# Patient Record
Sex: Male | Born: 1955 | Race: Black or African American | Hispanic: No | Marital: Married | Smoking: Never smoker
Health system: Southern US, Community
[De-identification: ages and names within clinical notes are randomized; demographics above are authoritative.]

## PROBLEM LIST (undated history)

## (undated) DIAGNOSIS — R600 Localized edema: Secondary | ICD-10-CM

## (undated) DIAGNOSIS — M2540 Effusion, unspecified joint: Secondary | ICD-10-CM

## (undated) DIAGNOSIS — I10 Essential (primary) hypertension: Secondary | ICD-10-CM

## (undated) DIAGNOSIS — M2550 Pain in unspecified joint: Secondary | ICD-10-CM

## (undated) DIAGNOSIS — G4730 Sleep apnea, unspecified: Secondary | ICD-10-CM

## (undated) DIAGNOSIS — R609 Edema, unspecified: Secondary | ICD-10-CM

## (undated) DIAGNOSIS — M1990 Unspecified osteoarthritis, unspecified site: Secondary | ICD-10-CM

## (undated) DIAGNOSIS — E785 Hyperlipidemia, unspecified: Secondary | ICD-10-CM

## (undated) HISTORY — PX: ACHILLES TENDON SURGERY: SHX542

## (undated) HISTORY — PX: KNEE ARTHROSCOPY: SUR90

## (undated) HISTORY — PX: COLONOSCOPY: SHX174

## (undated) HISTORY — PX: TONSILLECTOMY: SUR1361

---

## 2003-02-08 ENCOUNTER — Ambulatory Visit (HOSPITAL_COMMUNITY): Admission: RE | Admit: 2003-02-08 | Discharge: 2003-02-08 | Payer: Self-pay | Admitting: Orthopedic Surgery

## 2003-02-08 ENCOUNTER — Ambulatory Visit (HOSPITAL_BASED_OUTPATIENT_CLINIC_OR_DEPARTMENT_OTHER): Admission: RE | Admit: 2003-02-08 | Discharge: 2003-02-08 | Payer: Self-pay | Admitting: Orthopedic Surgery

## 2004-02-20 HISTORY — PX: JOINT REPLACEMENT: SHX530

## 2004-06-21 ENCOUNTER — Inpatient Hospital Stay (HOSPITAL_COMMUNITY): Admission: RE | Admit: 2004-06-21 | Discharge: 2004-06-25 | Payer: Self-pay | Admitting: Orthopedic Surgery

## 2005-11-13 ENCOUNTER — Inpatient Hospital Stay (HOSPITAL_COMMUNITY): Admission: EM | Admit: 2005-11-13 | Discharge: 2005-11-20 | Payer: Self-pay | Admitting: Emergency Medicine

## 2005-11-13 ENCOUNTER — Ambulatory Visit: Payer: Self-pay | Admitting: Internal Medicine

## 2005-11-14 ENCOUNTER — Encounter (INDEPENDENT_AMBULATORY_CARE_PROVIDER_SITE_OTHER): Payer: Self-pay | Admitting: Cardiovascular Disease

## 2005-11-14 ENCOUNTER — Encounter: Payer: Self-pay | Admitting: Vascular Surgery

## 2005-11-28 ENCOUNTER — Ambulatory Visit (HOSPITAL_BASED_OUTPATIENT_CLINIC_OR_DEPARTMENT_OTHER): Admission: RE | Admit: 2005-11-28 | Discharge: 2005-11-28 | Payer: Self-pay | Admitting: Cardiology

## 2005-12-02 ENCOUNTER — Ambulatory Visit: Payer: Self-pay | Admitting: Internal Medicine

## 2006-02-27 ENCOUNTER — Ambulatory Visit (HOSPITAL_BASED_OUTPATIENT_CLINIC_OR_DEPARTMENT_OTHER): Admission: RE | Admit: 2006-02-27 | Discharge: 2006-02-27 | Payer: Self-pay | Admitting: Cardiology

## 2006-03-03 ENCOUNTER — Ambulatory Visit: Payer: Self-pay | Admitting: Internal Medicine

## 2006-03-04 ENCOUNTER — Ambulatory Visit (HOSPITAL_BASED_OUTPATIENT_CLINIC_OR_DEPARTMENT_OTHER): Admission: RE | Admit: 2006-03-04 | Discharge: 2006-03-04 | Payer: Self-pay | Admitting: Cardiology

## 2006-10-16 ENCOUNTER — Ambulatory Visit (HOSPITAL_BASED_OUTPATIENT_CLINIC_OR_DEPARTMENT_OTHER): Admission: RE | Admit: 2006-10-16 | Discharge: 2006-10-16 | Payer: Self-pay | Admitting: Cardiology

## 2006-10-16 ENCOUNTER — Ambulatory Visit: Payer: Self-pay | Admitting: Internal Medicine

## 2007-11-04 ENCOUNTER — Ambulatory Visit (HOSPITAL_BASED_OUTPATIENT_CLINIC_OR_DEPARTMENT_OTHER): Admission: RE | Admit: 2007-11-04 | Discharge: 2007-11-04 | Payer: Self-pay | Admitting: *Deleted

## 2007-11-08 ENCOUNTER — Ambulatory Visit: Payer: Self-pay | Admitting: Internal Medicine

## 2008-10-18 ENCOUNTER — Ambulatory Visit (HOSPITAL_BASED_OUTPATIENT_CLINIC_OR_DEPARTMENT_OTHER): Admission: RE | Admit: 2008-10-18 | Discharge: 2008-10-18 | Payer: Self-pay | Admitting: Cardiology

## 2008-10-23 ENCOUNTER — Ambulatory Visit: Payer: Self-pay | Admitting: Internal Medicine

## 2009-10-25 ENCOUNTER — Ambulatory Visit (HOSPITAL_BASED_OUTPATIENT_CLINIC_OR_DEPARTMENT_OTHER): Admission: RE | Admit: 2009-10-25 | Discharge: 2009-10-25 | Payer: Self-pay | Admitting: Cardiology

## 2009-10-29 ENCOUNTER — Ambulatory Visit: Payer: Self-pay | Admitting: Internal Medicine

## 2010-07-04 NOTE — Procedures (Signed)
NAMEMARQUARIUS, [name]              ACCOUNT NO.:  [id number]   MEDICAL RECORD NO.:  [id number]          PATIENT TYPE:  OUT   LOCATION:  SLEEP CENTER                 FACILITY:  [facility]   PHYSICIAN:  Clinton D. Maple Hudson, MD, FCCP, FACPDATE OF BIRTH:  01/17/1957   DATE OF STUDY:  10/16/2006                        MAINTENANCE OF WAKEFULNESS TEST   REFERRING PHYSICIAN:  Osvaldo Shipper. Spruill, M.D.   INDICATION FOR STUDY:  Maintenance of wakefulness test requested for  evaluation of possible daytime hypersomnolence.  The patient has been  diagnosed with obstructive sleep apnea.   EPWORTH SLEEPINESS SCORE:  4/24.   BMI:  47.6.   WEIGHT:  315 pounds.   HOME MEDICATIONS:  Listed and reviewed.   A diagnostic NPSG on November 28, 2005 recorded an AHPI of 90.4 per hour  and required supplemental oxygen.  Subsequent CPAP titration on February 27, 2006, achieved good control at CPAP 24 cwp for an AHI of 2.7 per  hour.  A maintenance of wakefulness test on March 04, 2006 was normal  recording no sleep on any of the nap opportunities.  Followup NWT  is now requested.  The patient is currently using BiPAP inspiratory  20/expiratory 10 cwp.   NAP 1:  8:00 a.m.  Time in bed 40 minutes.  Sleep time 0.   NAP 2:  10:02 a.m. Time in bed 40 minutes.  Sleep time 0.   NAP 3:  12:00 p.m. Time in bed 40 minutes.  Total sleep time 0.   NAP 4:  2:00 p.m.  Time in bed 40 minutes.  Total sleep time 0.   MEAN SLEEP LATENCY:  Across all tests was 40 minutes, indicating absence  of sleep through this study.   IMPRESSION:  With no sleep on any nap, mean sleep latency was 40  minutes; and this is considered a normal study with no evidence of  daytime hypersomnolence.      Clinton D. Maple Hudson, MD, [facility], FACP  Diplomate, Biomedical engineer of Sleep Medicine  Electronically Signed     CDY/MEDQ  D:  10/20/2006 14:04:38  T:  10/21/2006 07:44:04  Job:  [id number]

## 2010-07-04 NOTE — Procedures (Signed)
[name], [name]              ACCOUNT NO.:  [id number]   MEDICAL RECORD NO.:  [id number]          PATIENT TYPE:  OUT   LOCATION:  SLEEP CENTER                 FACILITY:  [facility]   PHYSICIAN:  Clinton D. Maple Hudson, MD, FCCP, FACPDATE OF BIRTH:  1955/12/27   DATE OF STUDY:  10/18/2008                        MAINTENANCE OF WAKEFULNESS TEST   REFERRING PHYSICIAN:   REFERRING PHYSICIAN:  Osvaldo Shipper. Spruill, MD   INDICATIONS FOR STUDY:  Obstructive sleep apnea with hypersomnia.  Truck  driver.   EPWORTH SLEEPINESS SCORE:  Epworth sleepiness score 4/24, BMI 46.  Weight 5 feet 9 inches, weight 310 pounds.  Neck 19.5 inches.   HOME MEDICATIONS:  Charted and reviewed.   TEST ARCHITECTURE:  Test #1, 8 a.m., sleep latency 40 minutes, REM  latency NA.  Test #2, 10 a.m., sleep latency 40 minutes, REM latency NA.  Test #3, 12 noon, sleep latency 40 minutes, REM latency NA.  Test #4,  1400 hours, sleep latency 40 minutes, REM latency NA.   IMPRESSION/RECOMMENDATIONS:  1. The patient took no medications during the day per protocol and      maintained wakefulness during each test session with no evidence of      hypersomnia.  2. EKG was normal.  3. No unusual behavior or movement observed.  4. Original baseline diagnostic NPSG on November 28, 2005, had recorded      an AHI of 90 per hour with subsequent CPAP on February 27, 2006.      Clinton D. Maple Hudson, MD, [facility], FACP  Diplomate, Biomedical engineer of Sleep Medicine  Electronically Signed     CDY/MEDQ  D:  10/23/2008 10:54:24  T:  10/23/2008 16:10:96  Job:  [id number]

## 2010-07-04 NOTE — Procedures (Signed)
[name], [name]              ACCOUNT NO.:  [id number]   MEDICAL RECORD NO.:  [id number]          PATIENT TYPE:  OUT   LOCATION:  SLEEP CENTER                 FACILITY:  [facility]   PHYSICIAN:  Clinton D. Maple Hudson, MD, FCCP, FACPDATE OF BIRTH:  [birth year]-26-56   DATE OF STUDY:  11/04/2007                        MAINTENANCE OF WAKEFULNESS TEST   REFERRING PHYSICIAN:  Osvaldo Shipper. Spruill, M.D.   REFERRING PHYSICIAN:  Osvaldo Shipper. Spruill, M.D.   INDICATION FOR STUDY:  Maintenance of wakefulness test report for  evaluation of hypersomnia after treatment for sleep apnea.   EPWORTH SLEEPINESS SCORE:  3/24.  BMI of 45.  Height 5 feet 9 inches,  weight 308 pounds.  Neck 19.5 inches.   HOME MEDICATION:  Exforge 10/320, Lasix 20 mg, Lipitor 40 mg, Celebrex  200 mg, Janumet 50/1000.  No medications were taken throughout the study  day.   NAP 1:  8:00 a.m.  Sleep latency 40 minutes.  REM latency NA.   NAP 2:  10:00 a.m.  Sleep latency 40 minutes.  REM latency NA.   NAP 3:  12:06 p.m.  Sleep latency 40 minutes.  REM latency.   NAP 4:  1400 p.m.  Sleep latency 40 minutes.  REM latency NA.   MEAN SLEEP LATENCY/SUSTAINED WAKEFULNESS:  40 minutes.   NAPS WITH SLEEP:  Zero.   IMPRESSION/RECOMMENDATIONS:  1. The patient sustained wakefulness throughout the day and throughout      each session with no medications or stimulants taken.  He had worn      his BiPAP device (22/10 CWP) the night before from 2200 to 0430      hours.  There is no evidence of daytime somnolence on this study.  2. Original baseline diagnostic NPSG on November 28, 2005, had recorded      an AHI of 90 per hour.  Subsequent CPAP titration to 24 CWP was      adjusted for comfort to bilevel PAP now with a pressure of 22/10.      A mean      sleep latency test on October 16, 2006, with an Epworth sleepiness      score of 4/24, recorded no sleep and no evidence of daytime      somnolence on that study.      Clinton D. Maple Hudson, MD,  [facility], FACP  Diplomate, Biomedical engineer of Sleep Medicine  Electronically Signed     CDY/MEDQ  D:  11/08/2007 09:58:16  T:  11/10/2007 11:06:40  Job:  [id number]

## 2010-07-07 NOTE — Discharge Summary (Signed)
[name], [name]              ACCOUNT NO.:  [id number]   MEDICAL RECORD NO.:  [id number]          PATIENT TYPE:  INP   LOCATION:  2906                         FACILITY:  [facility]   PHYSICIAN:  Osvaldo Shipper. Spruill, M.D.DATE OF BIRTH:  1955-11-20   DATE OF ADMISSION:  11/13/2005  DATE OF DISCHARGE:  11/20/2005                                 DISCHARGE SUMMARY   ADMISSION OR PROVISIONAL DIAGNOSIS:  1. Congestive heart failure.  2. Uncontrolled hypertension.  3. Uncontrolled diabetes mellitus type 2.  4. Obesity.   DISCHARGE DIAGNOSES:  1. Compensated congestive heart failure.  2. Uncontrolled hypertension now controlled.  3. Type 2 diabetes mellitus.  4. Obesity.  5. Obesity hypoventilation syndrome.  6. Suspected sleep apnea with bradydysrhythmia.   BRIEF HISTORY AND REASON FOR ADMISSION:  This 55 year old black male was  admitted to the hospital with complaints of severe dyspnea.  The patient  reports that he has had a little high blood pressure all of his life, from  the early 20's, and recently has stopped taking his hydrochlorothiazide.  After discontinuing his hydrochlorothiazide he reports becoming short of  breath.  He came to the emergency room, was evaluated and was subsequently  admitted to the hospital with dyspnea.  The patient denied any severe chest  pain, further denied any severe leg edema, although some was present, and  also had no problems with any occupational exposure.  Due to the patient's  severe dyspnea, he was initially evaluated in the emergency room to exclude  heart failure as well as acute pulmonary embolism and was subsequently  admitted to the hospital as his dyspnea did not improve.  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  He will be admitted to the hospital for acute  treatment of suspected congestive heart failure.   LABORATORY STUDIES:  Laboratory studies obtained on the  patient's initial  presentation revealed, on September 25, a pH of 7.38, pCO2 of 69.2 and a pO2  of 79.  Hemoglobin on admission was 14.2, hematocrit 43.9, white blood cell  count 10,000.  Serum sodium was 142, potassium 4, chloride 97, CO2 content  36, BUN 13 and creatinine 1.  The patient's initial cardiac enzymes, CK and  troponin were both negative.   The patient's BNP was 140 on initial presentation, thyroid stimulating  hormone was normal.  The patient's hemoglobin A1c was 8.1 on initial  presentation.  His radiologic studies revealed the following:  On September  28, the patient had a Myoview scan of his heart which revealed an ejection  fraction of 52% and his chest x-ray revealed a dilated left ventricular  chamber.  Chest x-rays also revealed __________ and poor inspiratory pattern  on initial presentation and he also had a CT scan of the chest on initial  presentation which revealed edema on his CT but no evidence of any thrombi.   HOSPITAL COURSE:  The patient was admitted with the initial diagnosis of  congestive heart failure.  However, his Myoview perfusion scan and his  BNP  only showed mild heart failure, so other causes were sought for the  patient's complaints of severe dyspnea.  He was given IV Lasix for his heart  failure symptomatology and he improved somewhat.   After the patient was placed on telemetry baseline laboratory studies were  obtained and I included in the body of the patient's chart he revealed  evidence of moderate to severe bradydysrhythmia.  It was discovered that the  patient began his bradydysrhythmia at sleep and it was strongly suspected  that he has sleep apnea.   A consultation was requested by Pulmonary Medicine who evaluated the patient  and deduced that the patient had obesity hypoventilation syndrome as well as  probable sleep apnea.   Arrangements were made for the patient to get a sleep study so it could be  determined what CPAP  pressure he needs at night and he was placed on BiPAP  while he was hospitalized.   While on BiPAP the patient did not reveal any evidence of severe  bradydysrhythmia and it is strongly suspected that he does have severe  obstructive sleep apnea.   Adjustments were made in the patient's medications as well, as he is  diabetic and has been so for several years.  He also has hypertension.  Adjustments were made in his medications as well.  The patient showed some  improvement toward the end of his hospital course.  He was able to ambulate  without drops in his saturation levels.  His sats stayed at 90 or above with  ambulation and it was felt that he had reached maximum hospital benefit.   DISCHARGE MEDICATIONS:  1. Altace 5 mg p.o. daily.  2. Glucophage 500 mg p.o. b.i.d.  3. Januvia 100 mg p.o. daily.  4. K-Dur 20 mEq one p.o. daily.  5. Lasix 40 mg one p.o. daily.   The patient will be given instructions to have a sleep study as an  outpatient and also will return to my office in approximately 2 weeks for  further follow up.   The patient will also be referred to Pulmonary Medicine for follow up and  treatment of his obesity hypoventilation syndrome as well as for suspected  obstructive sleep apnea.   OVERALL CONDITION ON DISCHARGE:  Improved.   The patient will be discharged home with further office follow up.      Osvaldo Shipper. Spruill, M.D.  Electronically Signed     JOS/MEDQ  D:  11/21/2005  T:  11/22/2005  Job:  [id number]

## 2010-07-07 NOTE — Procedures (Signed)
[name], [name]              ACCOUNT NO.:  [id number]   MEDICAL RECORD NO.:  [id number]          PATIENT TYPE:  OUT   LOCATION:  SLEEP CENTER                 FACILITY:  [facility]   PHYSICIAN:  Clinton D. Maple Hudson, MD, FCCP, FACPDATE OF BIRTH:  07[birth year]   DATE OF STUDY:  03/04/2006                        MAINTENANCE OF WAKEFULNESS TEST   INDICATION FOR STUDY:  Documented obstructive sleep apnea, now on CPAP.  Baseline diagnostic NPSG on November 28, 2005 recorded an AHI of 90.4 per  hour with supplemental oxygen provided.  CPAP titration on February 27, 2006, gave best pressure setting of 24 CWP.   EPWORTH SLEEPINESS SCORE:  9/24   BMI:   MEDICATIONS:  Listed and reviewed.  Concern is with residual daytime  somnolence.   Patient maintained wakefulness through the entire procedure.  No  medications were taken during the day.   Nap Times:              Sleep Latency:                REM Latency:  1)  0800                      40 minutes (no sleep)  2)  1000                      40 minutes (no sleep)  3)  1200                      40 minutes (no sleep)  4)  1400  5)  1600                      40 minutes (no sleep)    MEAN SLEEP LATENCY:   COMMENTS:  Patient maintained wakefulness through the entire procedure.  No medications were taken during the day.   IMPRESSIONS-RECOMMENDATIONS:  Mean sleep latency of 40 minutes indicates  no evidence of excessive daytime sleepiness during this recording, using  standard maintenance of wakefulness test protocol.  The patient had used  home CPAP for nocturnal sleep on the night before.     Clinton D. Maple Hudson, MD, [facility], FACP  Diplomate, Biomedical engineer of Sleep Medicine  Electronically Signed    CDY/MEDQ  D:  03/10/2006 11:54:58  T:  03/10/2006 18:47:09  Job:  [id number]

## 2010-07-07 NOTE — Op Note (Signed)
[name], [name]                          ACCOUNT NO.:  [id number]   MEDICAL RECORD NO.:  [id number]                   PATIENT TYPE:  AMB   LOCATION:  DSC                                  FACILITY:  [facility]   PHYSICIAN:  Harvie Junior, M.D.                DATE OF BIRTH:  Nov 20, 1955   DATE OF PROCEDURE:  02/08/2003  DATE OF DISCHARGE:                                 OPERATIVE REPORT   PREOPERATIVE DIAGNOSIS:  Achilles tendinosis with Haglund's deformity.   POSTOPERATIVE DIAGNOSIS:  Achilles tendinosis with Haglund's deformity.   OPERATION PERFORMED:  1. Excision of bony exostosis of the calcaneus, i.e. Haglund's deformity.  2. Debridement of 20% of the Achilles tendon in the coronal plane.   SURGEON:  Harvie Junior, M.D.   ASSISTANT:  Marshia Ly, P.A.   ANESTHESIA:  General.   INDICATIONS FOR PROCEDURE:  The patient is a 55 year old male with a history  of having severe left ankle and heel pain.  He basically had been treated  conservatively for a long period of time with anti-inflammatory medication,  activity modification and physical therapy. None of this worked and because  of continuing complaints of pain he was ultimately evaluated.  MRI was  obtained which showed that he had significant partial thickness Achilles  tendon tear as well as large Haglund's deformity with bursitis.  Because of  these complaints, the patient was ultimately taken to the operating room for  debridement of the Haglund's deformity and evaluation and debridement of the  Achilles tendon as needed.   DESCRIPTION OF PROCEDURE:  The patient was taken to the operating room and  after adequate anesthesia was obtained with general anesthetic, the patient  was placed prone on the operating table.  All bony prominences were well  padded.  Care was taken to make sure that the face and genitalia were clear.  At this point attention was turned to the left leg where a tourniquet was  put in place and  after prep and drape of the left heel, the blood pressure  was inflated to 250 mmHg.  Following this, attention was turned to the left  side where an incision was made.  Subcutaneous tissues were dissected down  to the level of the calcaneus and subperiosteal reflection of the Achilles  tendon and periosteum was undertaken.  At that point, the Haglund's  deformity was clearly identified and under fluoroscopic guidance, the  Haglund's deformity was debrided.  Attention was turned to the Achilles  tendon which was dramatically thickened in the coronal plane.  Ultimately  20% of the width of the tendon was taken down at this point and once this  had been completed, the bursectomy was undertaken in that area and an OEC  was used to make sure that enough of the bony portion had been taken.  Calcification within the Achilles tendon had been debrided and the  tendinosis  had been debrided.  At this point the heel was copiously  irrigated and suctioned dry.  Bone wax was used over the exposed cancellous  surface.  The periosteal area on the lateral side was then closed and the  subcu  was closed with 0 and 2-0 Vicryl and the skin with a 3-0 nylon interrupted  suture.  Sterile compressive dressing applied as well as foot in slight  plantar flexion.  The patient was then transferred to the recovery room  where she was noted to be in satisfactory condition.  The estimated blood  loss for this procedure was none.                                               Harvie Junior, M.D.    Ranae Plumber  D:  02/08/2003  T:  02/09/2003  Job:  [id number]

## 2010-07-07 NOTE — Procedures (Signed)
NAMEARLANDER, Jeffrey              ACCOUNT NO.:  [id number]   MEDICAL RECORD NO.:  [id number]           PATIENT TYPE:   LOCATION:  SLEEP CENTER                 FACILITY:  [facility]   PHYSICIAN:  Clinton D. Maple Hudson, MD, FCCP, FACPDATE OF BIRTH:   DATE OF STUDY:  02/27/2006                            NOCTURNAL POLYSOMNOGRAM   REFERRING PHYSICIAN:  Osvaldo Shipper. Spruill, M.D.   INDICATION FOR STUDY:  Hypersomnia with sleep apnea.  Epworth sleepiness  score 9/24, BMI 46.1, weight 305 pounds.  Medications are listed and  reviewed.  CPAP titration is requested.  A diagnostic study on November 28, 2005 recorded an AHI of 90.4 per hour.   SLEEP ARCHITECTURE:  Total sleep time 321 minutes with sleep efficiency  81%.  Stage I was 5%, stage II 69%, stages III and IV were absent, REM  26% of total sleep time.  Sleep latency 8.5 minutes REM latency 48  minutes, awake after sleep onset 58 minutes, arousal index 11.  No  bedtime medication was taken.   RESPIRATORY DATA:  CPAP titration protocol.  CPAP was titrated to 24  CWP, AHI 2.7 per hour.  His personal medium [employer] 432 full  face mas was used initially, but had excessive leak.  Finally, a large  Respironics Comfort Lite II mask with large nasal pillows and chin strap  provided the best fit of several masks tried.   OXYGEN DATA:  Snoring was finally prevented at final pressures with  oxygen saturation 94% on room air.   CARDIAC DATA:  Sinus rhythm.   MOVEMENT/PARASOMNIA:  Occasional limb jerk with little effect on sleep.   IMPRESSION/RECOMMENDATION:  1. Successful CPAP titration at 24 CWP, AHI 2.7 per hour.  Several      masks were tried as noted before settling on a Respironics Comfort      Lite II with large nasal pillows and a chin strap.  Pressures as      high as 24 CWP will require a bilevel machine and the patient may      be more comfortable with a pressure adjustment, taking advantage of      that machine.  Consider an  initial trial with inspiratory pressure      24, expiratory pressure 18 CWP.  2. Baseline diagnostic NPSG on November 28, 2005 recorded an AHI of      90.4 per hour.      Clinton D. Maple Hudson, MD, [facility], FACP  Diplomate, Biomedical engineer of Sleep Medicine  Electronically Signed     CDY/MEDQ  D:  03/03/2006 12:34:38  T:  03/03/2006 23:53:03  Job:  [id number]

## 2010-07-07 NOTE — Discharge Summary (Signed)
[name], [name]              ACCOUNT NO.:  [id number]   MEDICAL RECORD NO.:  [id number]          PATIENT TYPE:  INP   LOCATION:  5038                         FACILITY:  [facility]   PHYSICIAN:  Harvie Junior, M.D.   DATE OF BIRTH:  06-05-1955   DATE OF ADMISSION:  06/21/2004  DATE OF DISCHARGE:  06/25/2004                                 DISCHARGE SUMMARY   ADMISSION DIAGNOSES:  1.  End-stage degenerative joint disease, right hip.  2.  Obesity.  3.  Type 2 diabetes mellitus.  4.  Hypertension.   DISCHARGE DIAGNOSES:  1.  End-stage degenerative joint disease, right hip.  2.  Obesity.  3.  Type 2 diabetes mellitus.  4.  Hypertension.   PROCEDURES IN HOSPITAL:  Right total hip arthroplasty by Jodi Geralds, M.D.  on Jun 21, 2004.   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  He has pain with ambulation and  night pain.  X-rays of his right hip show no fracture but did show end-stage  degenerative arthritis with complete loss of his joint space.  Based on his  clinical and radiographic findings and the fact that he did not improve with  conservative treatment, including modification of his activity and use of  pain medication, he is felt to be a candidate for a right total hip  arthroplasty and is admitted for this.   PERTINENT LABORATORY STUDIES:  EKG on admission showed a normal sinus rhythm  with a borderline left atrial enlargement.  No significant change since last  tracing of February 03, 2003.   Postoperative x-ray of the right hip showed right hip arthroplasty without  fracture or other complications.   Hemoglobin on admission was 15.4, hematocrit 46.8.  On postop day #1, his  hemoglobin was 7.7.  Postop day #2, 8.8.  Postop day #3, 8.4.  Indices were  within normal limits on the day of admission.  Pro time on admission was  12.4 seconds with an INR of 0.9.  PTT was 27.  On the day of discharge,  his  pro time was 17.1 seconds with an INR of 1.6.  CMET on admission was within  normal limits.  He had an elevated glucose at 149.  BMET on postop day #1  showed a creatinine of 2.1 and a glucose of 153.  Urinalysis on admission  was unremarkable on Jun 19, 2004 and on Jun 23, 2004, it was also negative.  Urine culture on Jun 23, 2004 showed no growth, one day.   HOSPITAL COURSE:  [name] underwent right total hip arthroplasty on Jun 21, 2004 by Dr. Luiz Blare and is well described in Dr. Luiz Blare' operative report.  Postoperatively, the patient was put on PCA and morphine pump for pain  control and was given Ancef 1 gm IV q.8h. x5 doses.  He was also started on  Lovenox due to the patient's size until his Coumadin was felt to be  therapeutic.  On postop day #1, he had moderate right hip pain.  He had not  gotten out of bed yet.  He had no chest pain or shortness of breath.  His  vital signs were stable.  He is afebrile.  He is a little tachycardic with a  pulse of 108.  O2 sats were 94% on 2 liters of oxygen.  Hemoglobin was 10.7.  INR was 1.3.  Creatinine was 2.1.  Potassium 4.8.  Sodium 135.   He has gotten out of bed with physical therapy.  Foley catheter was  continued.  IV fluids were instituted, and we gave him a bit of a bolus of  1000 cc of fluids.  The patient made good progress.  On postoperative day  #2, he had moderate right hip pain.  He was out of bed to the chair.  He was  taking fluids and voiding without difficulty.  His Foley catheter was  removed.  His postoperative x-rays of the right hip showed good position and  alignment of the prosthesis.  Hemoglobin was 8.8.  Pro time was 15.8 seconds  with an INR of 1.4.  His IV was converted to a saline lock.  His PCA and  morphine pump was discontinued.  His Foley catheter was discontinued.  He  was continued on Lovenox and Coumadin for DVT prophylaxis.  The patient had  good urine output at that point with good function.   On  postop day #3, he was making good progression.  He was then discharged  home on Jun 25, 2004 in improved condition.  He was on a regular diabetic  diet.  He was taking fluids without difficulty and voiding well.  He had  spiked a fever up to 100 and was found to be afebrile.  His vital signs are  stable.  His wounds had some mild serous drainage on the right hip.  The  dressing was changed, and the wound was painted with Betadine prior to  discharge.  The patient was discharged home in improved condition.   Activity status will be to ambulate with a walker, weightbearing as  tolerated on the right.  He is given an RX for Percocet p.r.n. for pain 5 mg  1-2 q.4-6h. p.r.n. pain.   I will follow up with Dr. Luiz Blare in one week.  He will change his right hip  dressing every other day.  He will notify us if he has any problems such as  increased fever, chills, sweats, increased hip pain, or redness around his  right hip wound.      Marshia Ly, P.A.      Harvie Junior, M.D.  Electronically Signed    JB/MEDQ  D:  10/11/2004  T:  10/11/2004  Job:  [id number]   cc:   Osvaldo Shipper. Spruill, M.D.  [address] [id number]  [place]  [place] [address]  Fax: [phone]

## 2010-07-07 NOTE — Procedures (Signed)
[name], [name]              ACCOUNT NO.:  [id number]   MEDICAL RECORD NO.:  [id number]          PATIENT TYPE:  OUT   LOCATION:  SLEEP CENTER                 FACILITY:  [facility]   PHYSICIAN:  Clinton D. Maple Hudson, MD, FCCP, FACPDATE OF BIRTH:  1956-11-[birth year]   DATE OF STUDY:                              NOCTURNAL POLYSOMNOGRAM   REFERRING PHYSICIAN:  Dr. Kevin Fenton Spruill   INDICATION FOR STUDY:  Hypersomnia with sleep apnea.   EPWORTH SLEEPINESS SCORE:  11/24.  BMI 47.6.  Weight 315 pounds.   HOME MEDICATIONS:  Simvastatin, Janumet 50/500, Diovan, Celebrex, potassium,  Lasix.   An NPSG diagnostic protocol was requested.   SLEEP ARCHITECTURE:  Total sleep time 365 minutes with sleep efficiency 81%.  Stage 1 was 19%, stage 2 64%, stages 3 and 4 were absent, REM 16% of total  sleep time.  Sleep latency 20 minutes, REM latency 103 minutes, awake after  sleep onset 64 minutes, arousal index 55.9 indicating marked sleep  fragmentation.  No bedtime medication was taken.   RESPIRATORY DATA:  NPSG protocol.  Apnea/hypopnea index (AHI, RDI) 90.4  obstructive events per hour indicating severe obstructive sleep  apnea/hypopnea syndrome.  There were 483 obstructive apneas, one mixed  apnea, and 66 hypopneas.  Events were not positional.  REM AHI 74.9.   OXYGEN DATA:  Very loud snoring.  Oxygen desaturated to 50%.  At 2320 the  patient was put on 1 L nasal oxygen and at 0007 the oxygen was increased to  2 L nasal prongs because of ongoing severe oxygen desaturation.  Desaturations continued through the night associated with apneas.   CARDIAC DATA:  Sinus arrhythmia consistent with vagal stimulation from apnea  events, with heart rate ranging 61-88 beats per minute.   MOVEMENT/PARASOMNIA:  Insignificant rare limb jerk.  Bathroom x1.   IMPRESSION/RECOMMENDATION:  1. Severe obstructive sleep apnea/hypopnea syndrome, apnea/hypopnea index      90.4 per hour with nonpositional events and very loud  snoring.  2. Supplemental oxygen was provided at 2 L per minute by nasal prongs      because of significant oxygen desaturation as low as 50%.  Mean oxygen      saturation through the night was 87% despite oxygen      supplementation, mainly reflecting repeated desaturations with apneas.  3. Consider early return for CPAP titration or evaluate for alternative      therapies on an urgent basis.      Clinton D. Maple Hudson, MD, [facility], FACP  Diplomate, Biomedical engineer of Sleep Medicine  Electronically Signed     CDY/MEDQ  D:  12/02/2005 09:21:11  T:  12/03/2005 11:36:14  Job:  [id number]

## 2010-07-07 NOTE — Discharge Summary (Signed)
NAMEKY, RUMPLE              ACCOUNT NO.:  [id number]   MEDICAL RECORD NO.:  [id number]          PATIENT TYPE:  INP   LOCATION:  2906                         FACILITY:  [facility]   PHYSICIAN:  Ivery Quale, P.A.    DATE OF BIRTH:  11/19/1955   DATE OF ADMISSION:  11/13/2005  DATE OF DISCHARGE:  11/20/2005                                 DISCHARGE SUMMARY   DISCHARGE DIAGNOSES:  1. Congestive heart failure.  2. Chronic respiratory failure.  3. Hypertension.  4. Type 2 diabetes mellitus.  5. Sleep apnea.   CONSULTATIONS:  Dr. Sandrea Hughs.   PROCEDURE:  Continuous positive airway pressure.   HISTORY OF PRESENT ILLNESS:  Mr. Knobel is a 55 year old patient who  presented initially to the emergency department of the [facility]  by ambulance with the complaint of shortness of breath.  It is of note that  the patient had been off the hydrochlorothiazide for 2-1/2 weeks and  presented to the emergency department with shortness of breath.  He had been  at work and got short of breath.  He went to a primary care and was then  sent to the emergency department.  On the examination in the emergency  department, he was noted to have pitting edema of the lower extremities.  His cardiac markers were negative.  His white blood cell count was slightly  elevated at 13,100.  His D-dimer was elevated at 1.24.  His potassium was  slightly low at 3.2.  His renal function was within normal limits.  His  liver function was within normal limits.  His B-natriuretic peptide was  elevated at 140.  The arterial blood gas revealed a pH of 7.38, PCO2 of  69.2, PO2 of 70, a bicarb of 41, and an O2 saturation of 95%.  Chest x-ray  questioned edema, but this was a poor inspiratory film.   The patient was subsequently admitted to the medical service.  He was seen  on consultation by pharmacy for heparin protocol.  The patient had a lower  extremity Doppler study done and there was no evidence of  DVT, SVT, or Baker  cyst.  CT scan of the chest was somewhat limited due to body habitus, but no  pulmonary emboli was seen.  There was some cardiomegaly with central  bilateral air-space opacities compatible with edema or congestive heart  failure.  Repeat cardiac markers were negative for acute injury.   During sleep, the patient's heart rate dropped to 44 beats per minute.  When  awakened, he stated that he felt fine.  After awakening, his heart rate came  up to the 60s and 70s without any significant problem.   On November 15, 2005, a consultation was obtained with Dr. Sherene Sires.  It was  Dr. Thurston Hole opinion that the patient needed a CPAP and this was ordered.  It  was also suggested that he be followed up by a sleep specialist and this was  arranged.  It was also the opinion that the patient probably had dyspnea due  to volume overload, especially since he had been without his  hydrochlorothiazide for approximately 2 weeks.   The patient was eventually transferred from the intensive care unit to the  stepdown.   The patient continued to show improvement and on November 20, 2005, it was the  opinion of the attending physician and consulting physician that the patient  had received maximum benefit from this hospitalization.  He responded well  to medication and treatment.  The patient was set up for BiPAP at home.   DISCHARGE MEDICATIONS:  1. Zocor 20 mg daily.  2. Diovan 320 mg daily.  3. Glucophage 500 mg b.i.d.  4. Lasix 20 mg daily.  5. BiPAP at bedtime.   FOLLOWUP:  The patient is scheduled for a sleep study on December 26, 2005,  at 8:30 p.m.  He is to use his Janimine 100 mg daily.  He is to notify the  physician immediately if any changes, problems, or concerns.      Ivery Quale, P.A.     HB/MEDQ  D:  12/25/2005  T:  12/26/2005  Job:  [id number]

## 2010-07-07 NOTE — Op Note (Signed)
NAMETROOPER, OLANDER              ACCOUNT NO.:  [id number]   MEDICAL RECORD NO.:  [id number]          PATIENT TYPE:  INP   LOCATION:  2550                         FACILITY:  [facility]   PHYSICIAN:  Harvie Junior, M.D.   DATE OF BIRTH:  Oct 14, 1955   DATE OF PROCEDURE:  06/21/2004  DATE OF DISCHARGE:                                 OPERATIVE REPORT   PREOPERATIVE DIAGNOSIS:  Degenerative joint disease, right hip, end stage.   POSTOPERATIVE DIAGNOSIS:  Degenerative joint disease, right hip, end stage.   OPERATION PERFORMED:  Right total hip replacement with the [facility]  S-ROM system, 2015 stem, 20D small cone, a 56 acetabulum with a 36 metal on  metal head.  The neck was a +3 on a 36+8 offset base neck stem.   SURGEON:  Harvie Junior, M.D.   ASSISTANT:  Marshia Ly, P.A.   ANESTHESIA:  General.   INDICATIONS FOR PROCEDURE:  He is a 55 year old gentleman who presented with  chief complaints of right hip pain and inability to move the right hip.  He  had been treated for prolonged period of time with anti-inflammatory  medication and activity modification.  Because of continued complaints of  significant pain, he was ultimately taken to the operating room for right  total hip replacement.   DESCRIPTION OF PROCEDURE:  The patient was taken to the operating room and  after anesthesia was obtained with general anesthetic.  The patient was  placed on the operating table and moved into the left lateral decubitus  position.  All bony prominences were well padded.  Attention was turned to  the right hip where after routine prep and drape, an incision was made and  subcutaneous tissue was dissected down to the level of the tensor fascia  which was divided in line with its fibers.  Subcutaneous tissue was  dissected down to the level of the posterior aspect of the hip.  The short  external rotators as well as the piriformis were released from the posterior  intertrochanteric  line.  The hip was then dislocated.  A provisional neck  cut was made and attention was turned to the acetabulum which was  sequentially reamed, medialized quite significantly and then sequentially  reamed to a level of 56.  Excellent bleeding bone was obtained and the 56  cup was hammered into place at this point.  Following this, the trial metal  liner was put in place and attention was turned to the stem side,  sequentially reamed to a level of 15 and a 15.5 was taken three quarters of  the way down.  Attention was then turned to the stem which was reamed to a  20D small cone.  Following this, attention was turned towards the trial  where a 36 +8 was put in place with 10 degrees of anteversion and a 36 + 3  ball.  The hip was trialed at this point and excellent range of motion was  achieved. There was a little bit of tendency towards dislocation.  It  appeared to be impinging on anterior osteophytes.  These were then debrided  and the final cup was put in place. The final cone was then seated 20/15.  The stem was then placed with 10 degrees of anteversion to the cone and the  attention was turned towards the trial ball.  A +3 ball was used with a 36  +8 stem.  Excellent range of motion, excellent lateralization.  The hip was  easily ranged at this point, no tendency towards subluxation, dislocation.  At this point the short external rotators and piriformis were reattached to  the posterior intertrochanteric line and the wound was copiously irrigated  and suctioned dry.  Tensor fascia closed with 1 Vicryl  running suture, the skin with 0 and 2-0 Vicryl and skin with skin staples.  Sterile compressive dressing was applied as well as a knee immobilizer and  patient taken to recovery where she was noted to be in satisfactory  condition.  The estimated blood loss for this procedure was none.      JLG/MEDQ  D:  06/21/2004  T:  06/21/2004  Job:  [id number]

## 2010-10-30 ENCOUNTER — Ambulatory Visit (HOSPITAL_BASED_OUTPATIENT_CLINIC_OR_DEPARTMENT_OTHER): Payer: PRIVATE HEALTH INSURANCE | Attending: Cardiology

## 2010-10-30 DIAGNOSIS — G4733 Obstructive sleep apnea (adult) (pediatric): Secondary | ICD-10-CM | POA: Insufficient documentation

## 2010-11-11 DIAGNOSIS — G4730 Sleep apnea, unspecified: Secondary | ICD-10-CM

## 2010-11-11 DIAGNOSIS — G4710 Hypersomnia, unspecified: Secondary | ICD-10-CM

## 2010-11-11 NOTE — Procedures (Signed)
NAMESOLON, [name]              ACCOUNT NO.:  [id number]  MEDICAL RECORD NO.:  [id number]          PATIENT TYPE:  OUT  LOCATION:  SLEEP CENTER                 FACILITY:  [facility]  PHYSICIAN:  Cassandra Mcmanaman D. Maple Hudson, MD, FCCP, FACPDATE OF BIRTH:  24-Jun-1955  DATE OF STUDY:  10/30/2010                       MAINTENANCE OF WAKEFULNESS TEST  REFERRING PHYSICIAN:  Osvaldo Shipper. Spruill, M.D.  INDICATION FOR STUDY:  Obstructive sleep apnea with hypersomnia.  Truck driver.  EPWORTH SLEEPINESS SCORE:  2/24.  BMI:  46.5.  Weight 315 pounds.  Height 69 inches.  Neck 19.5 inches.  MEDICATIONS:  Home medications are charted and reviewed.  NAP 1:  At 8 a.m., sleep latency 40 minutes (no sleep), REM latency NA.  NAP 2:  At 10 a.m., sleep latency 40 minutes, REM latency NA.  NAP 3:  At 12 noon, sleep latency 40 minutes, REM latency NA.  NAP 4:  At 1400 p.m., sleep latency 40 minutes, REM latency NA.  Mean sustained wakefulness 40 minutes.  NAP 5:   MEAN SLEEP LATENCY:  COMMENTS:  IMPRESSIONS-RECOMMENDATIONS: 1. No medications were taken during the day per protocol.  The patient     maintained wakefulness during each test session with no evidence of     hypersomnia.  This is a normal study. 2. Original baseline diagnostic and PSG on November 28, 2005 had     recorded an AHI of 19.4 per hour.  Weight was     315 pounds.  CPAP was titrated on February 27, 2006 to 24 CWP for an     AHI of 2.7 per hour.  Weight then was 305 pounds.     Lamia Mariner D. Maple Hudson, MD, [facility], FACP Diplomate, Biomedical engineer of Sleep Medicine Electronically Signed    CDY/MEDQ  D:  11/11/2010 09:32:37  T:  11/11/2010 09:46:26  Job:  [id number]

## 2011-03-05 ENCOUNTER — Other Ambulatory Visit: Payer: Self-pay | Admitting: Orthopedic Surgery

## 2011-03-05 DIAGNOSIS — M25512 Pain in left shoulder: Secondary | ICD-10-CM

## 2011-03-08 ENCOUNTER — Ambulatory Visit
Admission: RE | Admit: 2011-03-08 | Discharge: 2011-03-08 | Disposition: A | Discharge status: Home / Self Care | Payer: Worker's Compensation | Source: Ambulatory Visit | Attending: Orthopedic Surgery | Admitting: Orthopedic Surgery

## 2011-03-08 DIAGNOSIS — M25512 Pain in left shoulder: Secondary | ICD-10-CM

## 2011-03-10 ENCOUNTER — Other Ambulatory Visit: Payer: PRIVATE HEALTH INSURANCE

## 2011-03-20 ENCOUNTER — Other Ambulatory Visit: Payer: Self-pay | Admitting: Orthopedic Surgery

## 2011-03-21 ENCOUNTER — Encounter (HOSPITAL_BASED_OUTPATIENT_CLINIC_OR_DEPARTMENT_OTHER): Payer: Self-pay | Admitting: *Deleted

## 2011-03-21 NOTE — Progress Notes (Signed)
To come in for bmet-ekg-dr spruils office called for notes-and to let him know he is having this surg-did he have any issues concerning this-dr spruill will be off the next 5 days To to bring all meds-bipap-overnight bag

## 2011-03-22 ENCOUNTER — Encounter (HOSPITAL_BASED_OUTPATIENT_CLINIC_OR_DEPARTMENT_OTHER)
Admission: RE | Admit: 2011-03-22 | Discharge: 2011-03-22 | Disposition: A | Discharge status: Home / Self Care | Payer: Worker's Compensation | Source: Ambulatory Visit | Attending: Orthopedic Surgery | Admitting: Orthopedic Surgery

## 2011-03-22 NOTE — Progress Notes (Signed)
Reviewed notes with dr Sabra Heck for dsc

## 2011-03-23 ENCOUNTER — Encounter (HOSPITAL_BASED_OUTPATIENT_CLINIC_OR_DEPARTMENT_OTHER)
Admission: RE | Admit: 2011-03-23 | Discharge: 2011-03-23 | Disposition: A | Discharge status: Home / Self Care | Payer: Worker's Compensation | Source: Ambulatory Visit | Attending: Orthopedic Surgery | Admitting: Orthopedic Surgery

## 2011-03-23 ENCOUNTER — Other Ambulatory Visit: Payer: Self-pay

## 2011-03-23 LAB — BASIC METABOLIC PANEL
BUN: 23 mg/dL (ref 6–23)
CO2: 29 mEq/L (ref 19–32)
Calcium: 9.9 mg/dL (ref 8.4–10.5)
Creatinine, Ser: 0.8 mg/dL (ref 0.50–1.35)
GFR calc non Af Amer: >90 mL/min (ref >90)
Glucose, Bld: 242 mg/dL — ABNORMAL HIGH (ref 70–99)

## 2011-03-26 ENCOUNTER — Encounter (HOSPITAL_BASED_OUTPATIENT_CLINIC_OR_DEPARTMENT_OTHER): Payer: Self-pay | Admitting: Certified Registered Nurse Anesthetist

## 2011-03-26 ENCOUNTER — Ambulatory Visit (HOSPITAL_BASED_OUTPATIENT_CLINIC_OR_DEPARTMENT_OTHER)
Admission: RE | Admit: 2011-03-26 | Discharge: 2011-03-26 | Disposition: A | Discharge status: Home / Self Care | Payer: Worker's Compensation | Source: Ambulatory Visit | Attending: Orthopedic Surgery | Admitting: Orthopedic Surgery

## 2011-03-26 ENCOUNTER — Encounter (HOSPITAL_BASED_OUTPATIENT_CLINIC_OR_DEPARTMENT_OTHER): Payer: Self-pay

## 2011-03-26 ENCOUNTER — Encounter (HOSPITAL_BASED_OUTPATIENT_CLINIC_OR_DEPARTMENT_OTHER)
Admission: RE | Disposition: A | Discharge status: Home / Self Care | Payer: Self-pay | Source: Ambulatory Visit | Attending: Orthopedic Surgery

## 2011-03-26 ENCOUNTER — Ambulatory Visit (HOSPITAL_BASED_OUTPATIENT_CLINIC_OR_DEPARTMENT_OTHER): Payer: Worker's Compensation | Admitting: Certified Registered Nurse Anesthetist

## 2011-03-26 ENCOUNTER — Encounter (HOSPITAL_BASED_OUTPATIENT_CLINIC_OR_DEPARTMENT_OTHER): Payer: Self-pay | Admitting: Anesthesiology

## 2011-03-26 DIAGNOSIS — M67919 Unspecified disorder of synovium and tendon, unspecified shoulder: Secondary | ICD-10-CM | POA: Insufficient documentation

## 2011-03-26 DIAGNOSIS — Z01812 Encounter for preprocedural laboratory examination: Secondary | ICD-10-CM | POA: Insufficient documentation

## 2011-03-26 DIAGNOSIS — M719 Bursopathy, unspecified: Secondary | ICD-10-CM | POA: Insufficient documentation

## 2011-03-26 DIAGNOSIS — G4730 Sleep apnea, unspecified: Secondary | ICD-10-CM | POA: Insufficient documentation

## 2011-03-26 DIAGNOSIS — I10 Essential (primary) hypertension: Secondary | ICD-10-CM | POA: Insufficient documentation

## 2011-03-26 DIAGNOSIS — Z01810 Encounter for preprocedural cardiovascular examination: Secondary | ICD-10-CM | POA: Insufficient documentation

## 2011-03-26 DIAGNOSIS — K219 Gastro-esophageal reflux disease without esophagitis: Secondary | ICD-10-CM | POA: Insufficient documentation

## 2011-03-26 DIAGNOSIS — M75100 Unspecified rotator cuff tear or rupture of unspecified shoulder, not specified as traumatic: Secondary | ICD-10-CM

## 2011-03-26 DIAGNOSIS — E119 Type 2 diabetes mellitus without complications: Secondary | ICD-10-CM | POA: Insufficient documentation

## 2011-03-26 HISTORY — PX: ROTATOR CUFF REPAIR: SHX139

## 2011-03-26 HISTORY — DX: Hyperlipidemia, unspecified: E78.5

## 2011-03-26 HISTORY — DX: Essential (primary) hypertension: I10

## 2011-03-26 HISTORY — DX: Sleep apnea, unspecified: G47.30

## 2011-03-26 HISTORY — DX: Unspecified osteoarthritis, unspecified site: M19.90

## 2011-03-26 LAB — GLUCOSE, CAPILLARY: Glucose-Capillary: 154 mg/dL — ABNORMAL HIGH (ref 70–99)

## 2011-03-26 LAB — POCT HEMOGLOBIN-HEMACUE: Hemoglobin: 14.9 g/dL (ref 13.0–17.0)

## 2011-03-26 SURGERY — SHOULDER ARTHROSCOPY WITH ROTATOR CUFF REPAIR AND SUBACROMIAL DECOMPRESSION
Anesthesia: General | Site: Shoulder | Laterality: Left | Wound class: Clean

## 2011-03-26 MED ORDER — METOCLOPRAMIDE HCL 5 MG/ML IJ SOLN: 10.0000 mg | Freq: Once | INTRAMUSCULAR | Status: DC | PRN

## 2011-03-26 MED ORDER — POVIDONE-IODINE 7.5 % EX SOLN
Freq: Once | CUTANEOUS | Status: AC
  Administered 2011-03-26: 13:00:00 via TOPICAL

## 2011-03-26 MED ORDER — BUPIVACAINE HCL (PF) 0.25 % IJ SOLN
INTRAMUSCULAR | Status: DC | PRN
  Administered 2011-03-26: 15 mL

## 2011-03-26 MED ORDER — OXYCODONE-ACETAMINOPHEN 5-325 MG PO TABS: 1.0000 | ORAL_TABLET | ORAL | Status: AC | PRN

## 2011-03-26 MED ORDER — SODIUM CHLORIDE 0.9 % IR SOLN
Status: DC | PRN
  Administered 2011-03-26: 30000 mL

## 2011-03-26 MED ORDER — FENTANYL CITRATE 0.05 MG/ML IJ SOLN
50.0000 ug | INTRAMUSCULAR | Status: DC | PRN
  Administered 2011-03-26: 50 ug via INTRAVENOUS

## 2011-03-26 MED ORDER — PHENYLEPHRINE HCL 10 MG/ML IJ SOLN
INTRAMUSCULAR | Status: DC | PRN
  Administered 2011-03-26 (×4): 80 ug via INTRAVENOUS

## 2011-03-26 MED ORDER — FENTANYL CITRATE 0.05 MG/ML IJ SOLN
25.0000 ug | INTRAMUSCULAR | Status: DC | PRN
  Administered 2011-03-26 (×2): 25 ug via INTRAVENOUS

## 2011-03-26 MED ORDER — LACTATED RINGERS IV SOLN
INTRAVENOUS | Status: DC
  Administered 2011-03-26: 13:00:00 via INTRAVENOUS

## 2011-03-26 MED ORDER — CEFAZOLIN SODIUM-DEXTROSE 2-3 GM-% IV SOLR
2.0000 g | INTRAVENOUS | Status: AC
  Administered 2011-03-26: 2 g via INTRAVENOUS

## 2011-03-26 MED ORDER — SUCCINYLCHOLINE CHLORIDE 20 MG/ML IJ SOLN
INTRAMUSCULAR | Status: DC | PRN
  Administered 2011-03-26: 160 mg via INTRAVENOUS

## 2011-03-26 MED ORDER — FENTANYL CITRATE 0.05 MG/ML IJ SOLN
INTRAMUSCULAR | Status: DC | PRN
  Administered 2011-03-26 (×4): 50 ug via INTRAVENOUS

## 2011-03-26 MED ORDER — MIDAZOLAM HCL 2 MG/2ML IJ SOLN
1.0000 mg | INTRAMUSCULAR | Status: DC | PRN
  Administered 2011-03-26: 2 mg via INTRAVENOUS

## 2011-03-26 MED ORDER — MORPHINE SULFATE 10 MG/ML IJ SOLN: 0.0500 mg/kg | INTRAMUSCULAR | Status: DC | PRN

## 2011-03-26 MED ORDER — METOCLOPRAMIDE HCL 5 MG/ML IJ SOLN
INTRAMUSCULAR | Status: DC | PRN
  Administered 2011-03-26: 20 mg via INTRAVENOUS

## 2011-03-26 MED ORDER — ACETAMINOPHEN 10 MG/ML IV SOLN
1000.0000 mg | Freq: Once | INTRAVENOUS | Status: AC
  Administered 2011-03-26: 1000 mg via INTRAVENOUS

## 2011-03-26 MED ORDER — DROPERIDOL 2.5 MG/ML IJ SOLN
INTRAMUSCULAR | Status: DC | PRN
  Administered 2011-03-26: 0.625 mg via INTRAVENOUS

## 2011-03-26 MED ORDER — ONDANSETRON HCL 4 MG/2ML IJ SOLN
INTRAMUSCULAR | Status: DC | PRN
  Administered 2011-03-26: 4 mg via INTRAVENOUS

## 2011-03-26 MED ORDER — PROPOFOL 10 MG/ML IV EMUL
INTRAVENOUS | Status: DC | PRN
  Administered 2011-03-26: 300 mg via INTRAVENOUS
  Administered 2011-03-26: 50 mg via INTRAVENOUS

## 2011-03-26 MED ORDER — OXYCODONE HCL 5 MG PO TABS
5.0000 mg | ORAL_TABLET | Freq: Once | ORAL | Status: AC | PRN
  Administered 2011-03-26: 5 mg via ORAL

## 2011-03-26 SURGICAL SUPPLY — 91 items
ADH SKN CLS APL DERMABOND .7 (GAUZE/BANDAGES/DRESSINGS)
ANCH SUT 2 FT CRKSW 14.7X5.5 (Anchor) ×2 IMPLANT
ANCHOR CORKSCREW FIBER 5.5X15 (Anchor) ×2 IMPLANT
APL SKNCLS STERI-STRIP NONHPOA (GAUZE/BANDAGES/DRESSINGS)
BENZOIN TINCTURE PRP APPL 2/3 (GAUZE/BANDAGES/DRESSINGS) IMPLANT
BLADE 4.2CUDA (BLADE) IMPLANT
BLADE CUDA 5.5 (BLADE) IMPLANT
BLADE CUTTER GATOR 3.5 (BLADE) IMPLANT
BLADE GREAT WHITE 4.2 (BLADE) IMPLANT
BLADE SURG 15 STRL LF DISP TIS (BLADE) IMPLANT
BLADE SURG 15 STRL SS (BLADE)
BLADE SURG ROTATE 9660 (MISCELLANEOUS) IMPLANT
BLADE VORTEX 6.0 (BLADE) IMPLANT
BUR 3.5 LG SPHERICAL (BURR) IMPLANT
BUR OVAL 4.0 (BURR) ×2 IMPLANT
BUR OVAL 6.0 (BURR) ×1 IMPLANT
BURR 3.5 LG SPHERICAL (BURR)
CANISTER OMNI JUG 16 LITER (MISCELLANEOUS) ×1 IMPLANT
CANISTER SUCTION 2500CC (MISCELLANEOUS) ×1 IMPLANT
CANNULA 5.75X71 LONG (CANNULA) ×2 IMPLANT
CANNULA TWIST IN 8.25X7CM (CANNULA) ×2 IMPLANT
CHLORAPREP W/TINT 26ML (MISCELLANEOUS) ×2 IMPLANT
CLOTH BEACON ORANGE TIMEOUT ST (SAFETY) ×2 IMPLANT
DECANTER SPIKE VIAL GLASS SM (MISCELLANEOUS) IMPLANT
DERMABOND ADVANCED (GAUZE/BANDAGES/DRESSINGS)
DERMABOND ADVANCED .7 DNX12 (GAUZE/BANDAGES/DRESSINGS) IMPLANT
DRAPE INCISE IOBAN 66X45 STRL (DRAPES) ×2 IMPLANT
DRAPE STERI 35X30 U-POUCH (DRAPES) ×2 IMPLANT
DRAPE SURG 17X23 STRL (DRAPES) ×1 IMPLANT
DRAPE U 20/CS (DRAPES) ×2 IMPLANT
DRAPE U-SHAPE 47X51 STRL (DRAPES) ×2 IMPLANT
DRAPE U-SHAPE 76X120 STRL (DRAPES) ×4 IMPLANT
DRSG PAD ABDOMINAL 8X10 ST (GAUZE/BANDAGES/DRESSINGS) ×2 IMPLANT
ELECT REM PT RETURN 9FT ADLT (ELECTROSURGICAL) ×2
ELECTRODE REM PT RTRN 9FT ADLT (ELECTROSURGICAL) ×1 IMPLANT
GAUZE SPONGE 4X4 16PLY XRAY LF (GAUZE/BANDAGES/DRESSINGS) IMPLANT
GAUZE XEROFORM 1X8 LF (GAUZE/BANDAGES/DRESSINGS) ×2 IMPLANT
GLOVE BIO SURGEON STRL SZ7.5 (GLOVE) ×4 IMPLANT
GLOVE BIOGEL M STRL SZ7.5 (GLOVE) ×1 IMPLANT
GLOVE BIOGEL PI IND STRL 8 (GLOVE) ×2 IMPLANT
GLOVE BIOGEL PI INDICATOR 8 (GLOVE) ×2
GOWN PREVENTION PLUS XLARGE (GOWN DISPOSABLE) ×2 IMPLANT
GOWN PREVENTION PLUS XXLARGE (GOWN DISPOSABLE) ×2 IMPLANT
NDL 1/2 CIR CATGUT .05X1.09 (NEEDLE) IMPLANT
NDL SCORPION MULTI FIRE (NEEDLE) IMPLANT
NDL SUT 6 .5 CRC .975X.05 MAYO (NEEDLE) IMPLANT
NEEDLE 1/2 CIR CATGUT .05X1.09 (NEEDLE) IMPLANT
NEEDLE MAYO TAPER (NEEDLE)
NEEDLE SCORPION MULTI FIRE (NEEDLE) ×2 IMPLANT
NS IRRIG 1000ML POUR BTL (IV SOLUTION) IMPLANT
PACK ARTHROSCOPY DSU (CUSTOM PROCEDURE TRAY) ×2 IMPLANT
PACK BASIN DAY SURGERY FS (CUSTOM PROCEDURE TRAY) ×2 IMPLANT
PENCIL BUTTON HOLSTER BLD 10FT (ELECTRODE) IMPLANT
RESECTOR FULL RADIUS 4.2MM (BLADE) ×2 IMPLANT
RESECTOR FULL RADIUS 4.8MM (BLADE) IMPLANT
SHEET MEDIUM DRAPE 40X70 STRL (DRAPES) IMPLANT
SLEEVE SCD COMPRESS KNEE MED (MISCELLANEOUS) ×2 IMPLANT
SLING ARM FOAM STRAP LRG (SOFTGOODS) IMPLANT
SLING ARM FOAM STRAP MED (SOFTGOODS) IMPLANT
SLING ARM FOAM STRAP XLG (SOFTGOODS) ×1 IMPLANT
SLING ARM IMMOBILIZER MED (SOFTGOODS) IMPLANT
SPONGE GAUZE 4X4 12PLY (GAUZE/BANDAGES/DRESSINGS) ×2 IMPLANT
SPONGE LAP 4X18 X RAY DECT (DISPOSABLE) ×1 IMPLANT
STRIP CLOSURE SKIN 1/2X4 (GAUZE/BANDAGES/DRESSINGS) IMPLANT
SUCTION FRAZIER TIP 10 FR DISP (SUCTIONS) IMPLANT
SUPPORT WRAP ARM LG (MISCELLANEOUS) ×1 IMPLANT
SUT BONE WAX W31G (SUTURE) IMPLANT
SUT ETHIBOND 2 OS 4 DA (SUTURE) IMPLANT
SUT ETHILON 3 0 PS 1 (SUTURE) ×2 IMPLANT
SUT ETHILON 4 0 PS 2 18 (SUTURE) IMPLANT
SUT FIBERWIRE #2 38 T-5 BLUE (SUTURE)
SUT FIBERWIRE 2-0 18 17.9 3/8 (SUTURE)
SUT LASSO 45 DEGREE LEFT (SUTURE) ×1 IMPLANT
SUT MNCRL AB 3-0 PS2 18 (SUTURE) IMPLANT
SUT MNCRL AB 4-0 PS2 18 (SUTURE) IMPLANT
SUT PDS AB 0 CT 36 (SUTURE) IMPLANT
SUT PROLENE 3 0 PS 2 (SUTURE) IMPLANT
SUT VIC AB 0 CT1 18XCR BRD 8 (SUTURE) IMPLANT
SUT VIC AB 0 CT1 8-18 (SUTURE)
SUT VIC AB 2-0 SH 18 (SUTURE) IMPLANT
SUTURE FIBERWR #2 38 T-5 BLUE (SUTURE) IMPLANT
SUTURE FIBERWR 2-0 18 17.9 3/8 (SUTURE) IMPLANT
SYR BULB 3OZ (MISCELLANEOUS) IMPLANT
TAPE CLOTH SURG 6X10 WHT LF (GAUZE/BANDAGES/DRESSINGS) ×1 IMPLANT
TOWEL OR 17X24 6PK STRL BLUE (TOWEL DISPOSABLE) ×2 IMPLANT
TOWEL OR NON WOVEN STRL DISP B (DISPOSABLE) ×2 IMPLANT
TUBE CONNECTING 20X1/4 (TUBING) ×2 IMPLANT
TUBING ARTHROSCOPY IRRIG 16FT (MISCELLANEOUS) ×2 IMPLANT
WAND STAR VAC 90 (SURGICAL WAND) ×2 IMPLANT
WATER STERILE IRR 1000ML POUR (IV SOLUTION) ×2 IMPLANT
YANKAUER SUCT BULB TIP NO VENT (SUCTIONS) IMPLANT

## 2011-03-26 NOTE — Anesthesia Procedure Notes (Addendum)
Anesthesia Regional Block:  Interscalene brachial plexus block  Pre-Anesthetic Checklist: ,, timeout performed, Correct Patient, Correct Site, Correct Laterality, Correct Procedure, Correct Position, site marked, Risks and benefits discussed,  Surgical consent,  Pre-op evaluation,  At surgeon's request and post-op pain management  Laterality: Left  Prep: chloraprep       Needles:   Needle Type: Other   (Arrow Echogenic)   Needle Length: 9cm  Needle Gauge: 21    Additional Needles:  Procedures: ultrasound guided Interscalene brachial plexus block Narrative:  Start time: 03/26/2011 12:49 PM End time: 03/26/2011 12:56 PM Injection made incrementally with aspirations every 5 mL.  Performed by: Personally  Anesthesiologist: Aldona Lento, MD  Additional Notes: Ultrasound guidance used to: id relevant anatomy, confirm needle position, local anesthetic spread, avoidance of vascular puncture. Picture saved. No complications. Block performed personally by Janetta Hora. Gelene Mink, MD    Interscalene brachial plexus block Procedure Name: Intubation Date/Time: 03/26/2011 1:27 PM Performed by: Signa Kell Pre-anesthesia Checklist: Patient identified, Emergency Drugs available, Suction available and Patient being monitored Patient Re-evaluated:Patient Re-evaluated prior to inductionOxygen Delivery Method: Circle System Utilized Preoxygenation: Pre-oxygenation with 100% oxygen Intubation Type: IV induction Ventilation: Mask ventilation with difficulty Laryngoscope Size: Mac and 3 Grade View: Grade II Tube type: Oral Tube size: 8.0 mm Number of attempts: 1 Airway Equipment and Method: stylet Placement Confirmation: ETT inserted through vocal cords under direct vision,  positive ETCO2 and breath sounds checked- equal and bilateral Secured at: 22 cm Tube secured with: Tape Dental Injury: Teeth and Oropharynx as per pre-operative assessment

## 2011-03-26 NOTE — Anesthesia Preprocedure Evaluation (Signed)
Anesthesia Evaluation  Patient identified by MRN, date of birth, ID band Patient awake    Reviewed: Allergy & Precautions, H&P , NPO status , Patient's Chart, lab work & pertinent test results, reviewed documented beta blocker date and time   Airway Mallampati: II TM Distance: >3 FB Neck ROM: full    Dental   Pulmonary shortness of breath and with exertion, sleep apnea and Continuous Positive Airway Pressure Ventilation ,          Cardiovascular hypertension, On Medications     Neuro/Psych Negative Neurological ROS  Negative Psych ROS   GI/Hepatic negative GI ROS, Neg liver ROS,   Endo/Other  Diabetes mellitus-, Well Controlled, Type obesity  Renal/GU negative Renal ROS  Genitourinary negative   Musculoskeletal   Abdominal   Peds  Hematology negative hematology ROS (+)   Anesthesia Other Findings See surgeon's H&P   Reproductive/Obstetrics negative OB ROS                           Anesthesia Physical Anesthesia Plan  ASA: III  Anesthesia Plan: General   Post-op Pain Management: MAC Combined w/ Regional for Post-op pain   Induction: Intravenous  Airway Management Planned: Oral ETT  Additional Equipment:   Intra-op Plan:   Post-operative Plan: Extubation in OR  Informed Consent: I have reviewed the patients History and Physical, chart, labs and discussed the procedure including the risks, benefits and alternatives for the proposed anesthesia with the patient or authorized representative who has indicated his/her understanding and acceptance.     Plan Discussed with: CRNA and Surgeon  Anesthesia Plan Comments:         Anesthesia Quick Evaluation
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
## 2011-03-26 NOTE — Anesthesia Postprocedure Evaluation (Signed)
Anesthesia Post Note  Patient: [name]  Procedure(s) Performed:  SHOULDER ARTHROSCOPY WITH ROTATOR CUFF REPAIR AND SUBACROMIAL DECOMPRESSION - and distal clavicle excision  Anesthesia type: General  Patient location: PACU  Post pain: Pain level controlled  Post assessment: Patient's Cardiovascular Status Stable  Last Vitals:  Filed Vitals:   03/26/11 1700  BP: 116/51  Pulse: 67  Temp:   Resp: 18    Post vital signs: Reviewed and stable  Level of consciousness: alert  Complications: No apparent anesthesia complications

## 2011-03-26 NOTE — Progress Notes (Signed)
Assisted Dr. Frederick with left, ultrasound guided, supraclavicular block. Side rails up, monitors on throughout procedure. See vital signs in flow sheet. Tolerated Procedure well. 

## 2011-03-26 NOTE — Transfer of Care (Signed)
Immediate Anesthesia Transfer of Care Note  Patient: [name]  Procedure(s) Performed:  SHOULDER ARTHROSCOPY WITH ROTATOR CUFF REPAIR AND SUBACROMIAL DECOMPRESSION - and distal clavicle excision  Patient Location: PACU  Anesthesia Type: GA combined with regional for post-op pain  Level of Consciousness: awake and sedated  Airway & Oxygen Therapy: Patient Spontanous Breathing and Patient connected to face mask oxygen  Post-op Assessment: Report given to PACU RN and Post -op Vital signs reviewed and stable  Post vital signs: Reviewed and stable  Complications: No apparent anesthesia complications

## 2011-03-26 NOTE — H&P (Signed)
[name] is an 56 y.o. male.   Chief Complaint: L shoulder pain and weakness  HPI: L shoulder rotator cuff tear, symptomatic AC joint DJD.  C/o pain and weakness since accident.  Past Medical History  Diagnosis Date  . Shortness of breath   . Sleep apnea     uses bipap  . Hypertension   . CHF (congestive heart failure)   . Arthritis   . Hyperlipemia   . Diabetes mellitus     Past Surgical History  Procedure Date  . Joint replacement 2006    rt hip  . Achilles tendon surgery     lt  . Knee arthroscopy     lt  . Tonsillectomy   . Colonoscopy     History reviewed. No pertinent family history. Social History:  reports that he has never smoked. He does not have any smokeless tobacco history on file. He reports that he drinks alcohol. He reports that he does not use illicit drugs.  Allergies: No Known Allergies  Medications Prior to Admission  Medication Dose Route Frequency Provider Last Rate Last Dose  . ceFAZolin (ANCEF) IVPB 2 g/50 mL premix  2 g Intravenous 60 min Pre-Op       . lactated ringers infusion   Intravenous Continuous E. Jairo Ben, MD 20 mL/hr at 03/26/11 1235    . povidone-iodine (BETADINE) 7.5 % scrub   Topical Once        Medications Prior to Admission  Medication Sig Dispense Refill  . amLODipine-valsartan (EXFORGE) 10-320 MG per tablet Take 1 tablet by mouth daily.      Marland Kitchen atorvastatin (LIPITOR) 40 MG tablet Take 40 mg by mouth daily.      . celecoxib (CELEBREX) 200 MG capsule Take 200 mg by mouth 2 (two) times daily.      . furosemide (LASIX) 20 MG tablet Take 20 mg by mouth 1 day or 1 dose.      Marland Kitchen HYDROcodone-acetaminophen (NORCO) 5-325 MG per tablet Take 1 tablet by mouth every 6 (six) hours as needed.      . sitaGLIPtan-metformin (JANUMET) 50-1000 MG per tablet Take 1 tablet by mouth 2 (two) times daily with a meal.        Results for orders placed during the hospital encounter of 03/26/11 (from the past 48 hour(s))  GLUCOSE, CAPILLARY      Status: Abnormal   Collection Time   03/26/11 12:41 PM      Component Value Range Comment   Glucose-Capillary 146 (*) 70 - 99 (mg/dL)   POCT HEMOGLOBIN-HEMACUE     Status: Normal   Collection Time   03/26/11 12:44 PM      Component Value Range Comment   Hemoglobin 14.9  13.0 - 17.0 (g/dL)    No results found.  Review of Systems  All other systems reviewed and are negative.    Blood pressure 164/84, pulse 73, temperature 98.3 F (36.8 C), temperature source Oral, resp. rate 20, height 5\' 8"  (1.727 m), weight 136.079 kg (300 lb), SpO2 96.00%. Physical Exam  Constitutional: He is oriented to person, place, and time. He appears well-developed and well-nourished.  HENT:  Head: Atraumatic.  Eyes: EOM are normal.  Neck: Neck supple.  Cardiovascular: Intact distal pulses.   Respiratory: Effort normal.  Musculoskeletal:       Left shoulder: He exhibits decreased range of motion and tenderness.  Neurological: He is alert and oriented to person, place, and time.  Skin: Skin is warm and dry.  Psychiatric: He has a normal mood and affect.     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  Risks / benefits of surgery discussed Consent on chart  NPO for OR Preop antibiotics May need to stay over night due to OSA  Mable Paris 03/26/2011, 12:46 PM

## 2011-10-17 ENCOUNTER — Ambulatory Visit (HOSPITAL_BASED_OUTPATIENT_CLINIC_OR_DEPARTMENT_OTHER): Payer: PRIVATE HEALTH INSURANCE | Attending: Cardiology | Admitting: Radiology

## 2011-10-17 VITALS — Ht 68.0 in | Wt 300.0 lb

## 2011-10-17 DIAGNOSIS — G4733 Obstructive sleep apnea (adult) (pediatric): Secondary | ICD-10-CM | POA: Insufficient documentation

## 2011-10-21 DIAGNOSIS — G4733 Obstructive sleep apnea (adult) (pediatric): Secondary | ICD-10-CM

## 2011-10-21 NOTE — Procedures (Signed)
[name], [name]              ACCOUNT NO.:  [id number]  MEDICAL RECORD NO.:  [id number]          PATIENT TYPE:  OUT  LOCATION:  SLEEP CENTER                 FACILITY:  [facility]  PHYSICIAN:  Clinton D. Maple Hudson, MD, FCCP, FACPDATE OF BIRTH:  26-Jul-1955  DATE OF STUDY:  10/17/2011                       MAINTENANCE OF WAKEFULNESS TEST  REFERRING PHYSICIAN:  Osvaldo Shipper. Spruill, M.D.  INDICATION FOR STUDY:  Obstructive sleep apnea with history of hypersomnia.  Truck driver.  EPWORTH SLEEPINESS SCORE:  4/24.  BMI:  45.6, height 68 inches, weight 300 pounds, neck 20 inches.  MEDICATIONS:  Bedtime medications are charted and reviewed.  NAP 1: 1. 8:00 a.m.; sleep latency 40 minutes (no sleep), REM latency NA.  NAP 2:  10:00 a.m.; sleep latency 40 minutes (no sleep), REM latency NA.  NAP 3:  12 noon; sleep latency 40 minutes (no sleep), REM latency NA.  NAP 4:  1400 p.m.  Sleep latency 40 minutes (no sleep), REM latency NA.  MEAN SLEEP LATENCY:  40 minutes (no sleep).  COMMENTS:  No medication was taken during the study day, per protocol.  IMPRESSION-RECOMMENDATIONS: 1. The patient maintained wakefulness through each test interval with     no evidence of daytime sleepiness.  This is a normal study. 2. The patient indicated that he had worn his bilevel PAP device     October 16, 2011 for 7.5 hours on a set pressure, inspiratory 20 CWP     and expiratory 10 CWP as prescribed, demonstrating good control and     compliance. 3. His original baseline diagnostic NPSG on November 28, 2005, had     recorded an AHI of 19.4 per hour.  Weight at that study was 315     pounds.     Clinton D. Maple Hudson, MD, [facility], FACP Diplomate, American Board of Sleep Medicine    CDY/MEDQ  D:  10/21/2011 09:41:35  T:  10/21/2011 16:10:96  Job:  [id number]

## 2012-05-01 ENCOUNTER — Other Ambulatory Visit: Payer: Self-pay | Admitting: Orthopedic Surgery

## 2012-05-02 ENCOUNTER — Encounter (HOSPITAL_COMMUNITY): Payer: Self-pay | Admitting: Pharmacy Technician

## 2012-05-09 ENCOUNTER — Encounter (HOSPITAL_COMMUNITY)
Admission: RE | Admit: 2012-05-09 | Discharge: 2012-05-09 | Disposition: A | Discharge status: Home / Self Care | Payer: BC Managed Care – PPO | Source: Ambulatory Visit | Attending: Orthopedic Surgery | Admitting: Orthopedic Surgery

## 2012-05-09 ENCOUNTER — Encounter (HOSPITAL_COMMUNITY): Payer: Self-pay

## 2012-05-09 DIAGNOSIS — Z96649 Presence of unspecified artificial hip joint: Secondary | ICD-10-CM | POA: Insufficient documentation

## 2012-05-09 DIAGNOSIS — I10 Essential (primary) hypertension: Secondary | ICD-10-CM | POA: Insufficient documentation

## 2012-05-09 DIAGNOSIS — Z01812 Encounter for preprocedural laboratory examination: Secondary | ICD-10-CM | POA: Insufficient documentation

## 2012-05-09 DIAGNOSIS — E785 Hyperlipidemia, unspecified: Secondary | ICD-10-CM | POA: Insufficient documentation

## 2012-05-09 DIAGNOSIS — Z01810 Encounter for preprocedural cardiovascular examination: Secondary | ICD-10-CM | POA: Insufficient documentation

## 2012-05-09 DIAGNOSIS — I509 Heart failure, unspecified: Secondary | ICD-10-CM | POA: Insufficient documentation

## 2012-05-09 DIAGNOSIS — E119 Type 2 diabetes mellitus without complications: Secondary | ICD-10-CM | POA: Insufficient documentation

## 2012-05-09 DIAGNOSIS — G4733 Obstructive sleep apnea (adult) (pediatric): Secondary | ICD-10-CM | POA: Insufficient documentation

## 2012-05-09 LAB — COMPREHENSIVE METABOLIC PANEL
Albumin: 3.8 g/dL (ref 3.5–5.2)
Alkaline Phosphatase: 63 U/L (ref 39–117)
BUN: 17 mg/dL (ref 6–23)
Calcium: 9.8 mg/dL (ref 8.4–10.5)
Creatinine, Ser: 0.85 mg/dL (ref 0.50–1.35)
GFR calc Af Amer: >90 mL/min (ref >90)
GFR calc non Af Amer: >90 mL/min (ref >90)
Glucose, Bld: 171 mg/dL — ABNORMAL HIGH (ref 70–99)
Potassium: 3.9 mEq/L (ref 3.5–5.1)
Total Protein: 7.1 g/dL (ref 6.0–8.3)

## 2012-05-09 LAB — URINALYSIS, ROUTINE W REFLEX MICROSCOPIC
Bilirubin Urine: NEGATIVE
Glucose, UA: 100 mg/dL — AB
Ketones, ur: NEGATIVE mg/dL
Nitrite: NEGATIVE
Urobilinogen, UA: 1 mg/dL (ref 0.0–1.0)

## 2012-05-09 LAB — CBC WITH DIFFERENTIAL/PLATELET
Basophils Absolute: 0.1 10*3/uL (ref 0.0–0.1)
Basophils Relative: 0 % (ref 0–1)
Eosinophils Absolute: 0.2 10*3/uL (ref 0.0–0.7)
Eosinophils Relative: 2 % (ref 0–5)
HCT: 45.6 % (ref 39.0–52.0)
Hemoglobin: 15.3 g/dL (ref 13.0–17.0)
Lymphocytes Relative: 23 % (ref 12–46)
Lymphs Abs: 2.7 10*3/uL (ref 0.7–4.0)
MCH: 28.3 pg (ref 26.0–34.0)
MCHC: 33.6 g/dL (ref 30.0–36.0)
MCV: 84.3 fL (ref 78.0–100.0)
Monocytes Absolute: 0.9 10*3/uL (ref 0.1–1.0)
Monocytes Relative: 8 % (ref 3–12)
Neutro Abs: 7.9 10*3/uL — ABNORMAL HIGH (ref 1.7–7.7)
Neutrophils Relative %: 67 % (ref 43–77)
Platelets: 282 10*3/uL (ref 150–400)
RBC: 5.41 MIL/uL (ref 4.22–5.81)
RDW: 13.7 % (ref 11.5–15.5)
WBC: 11.8 10*3/uL — ABNORMAL HIGH (ref 4.0–10.5)

## 2012-05-09 LAB — TYPE AND SCREEN
ABO/RH(D): A POS
Antibody Screen: NEGATIVE

## 2012-05-09 LAB — ABO/RH: ABO/RH(D): A POS

## 2012-05-09 LAB — SURGICAL PCR SCREEN
MRSA, PCR: NEGATIVE
Staphylococcus aureus: NEGATIVE

## 2012-05-09 LAB — PROTIME-INR
INR: 0.9 (ref 0.00–1.49)
Prothrombin Time: 12.1 seconds (ref 11.6–15.2)

## 2012-05-09 NOTE — Pre-Procedure Instructions (Signed)
[name]  05/09/2012   Your procedure is scheduled on:  Friday March 28  Report to [name] at 8:15 AM.  Call this number if you have problems the morning of surgery: [phone]   Remember:   Do not eat food or drink liquids after midnight.   Take these medicines the morning of surgery with A SIP OF WATER: exforge   Do not wear jewelry, make-up or nail polish.  Do not wear lotions, powders, or perfumes. You may wear deodorant.  Do not shave 48 hours prior to surgery. Men may shave face and neck.  Do not bring valuables to the hospital.  Contacts, dentures or bridgework may not be worn into surgery.  Leave suitcase in the car. After surgery it may be brought to your room.  For patients admitted to the hospital, checkout time is 11:00 AM the day of  discharge.   Patients discharged the day of surgery will not be allowed to drive  home.  Name and phone number of your driver:   Special Instructions: Shower using CHG 2 nights before surgery and the night before surgery.  If you shower the day of surgery use CHG.  Use special wash - you have one bottle of CHG for all showers.  You should use approximately 1/3 of the bottle for each shower.   Please read over the following fact sheets that you were given: Pain Booklet, Coughing and Deep Breathing, Blood Transfusion Information, MRSA Information and Surgical Site Infection Prevention

## 2012-05-09 NOTE — Progress Notes (Signed)
Primary MD: Dr. Shana Chute ( Dr. Shana Chute also manages his cardiology aspect of care )

## 2012-05-12 NOTE — Progress Notes (Signed)
Anesthesia Chart Review:  Patient is a 57 year old male scheduled for left THA by Dr. Luiz Blare on 05/16/12.  History includes non-smoker, morbid obesity, CHF, HTN, HLD, DM2, OSA, arthritis.  He is s/p left shoulder rotator cuff repair 03/2011, right THA 06/21/04. PCP/Cardiologist is Dr. Shana Chute.  EKG from 05/09/12 showed NSR, LAD.  The interpreting cardiologist did not feel that it was significantly changed from his previous EKG on 03/23/11.  Nuclear stress test on 11/16/05 showed: Dilated left ventricle. Mild hypokinesis in the cardiac base. There may be a scar in the inferobasilar wall. No inducible ischemia is identified. EF 52%.  Echo on 11/14/05 showed: Overall left ventricular systolic function was normal. Left ventricular ejection fraction was estimated , range being 65 % to 70 %. There were no left ventricular regional wall motion abnormalities. There was Doppler evidence for dynamic left ventricular mid-cavity obstruction at rest, with a peak velocity of 1.9 m/sec , and with a peak gradient of 14 mmHg. - The aortic valve was trileaflet with normal leaflet excursion.  Aortic valve thickness was normal.  The aortic valve was calcified. Findings were consistent with very mild aortic valve stenosis. - Left atrial size was at the upper limits of normal.  I spoke with staff at Dr. Magda Kiel office staff who confirmed that Dr. Shana Chute is aware of upcoming surgery.  He saw patient on  04/14/12 following THA recommendations from Dr. Luiz Blare.  Apparently, patient will likely need a TKA in the future as well.  surgery.  There is no recent cardiac testing, but they will fax me the last office note.  Preoperative labs and CXR noted.  Patient's EKG is stable.  He has had recent PCP/Cardiology follow-up, and Dr.Spruill is aware of planned procedure.  If no significant change then would anticipate he could proceed as planned.    Velna Ochs [facility]/Anesthesiology Phone [phone] 05/12/2012 3:41 PM

## 2012-05-15 MED ORDER — DEXTROSE 5 % IV SOLN
3.0000 g | INTRAVENOUS | Status: AC
  Administered 2012-05-16: 3 g via INTRAVENOUS
  Filled 2012-05-15: qty 3000

## 2012-05-16 ENCOUNTER — Inpatient Hospital Stay (HOSPITAL_COMMUNITY): Payer: BC Managed Care – PPO

## 2012-05-16 ENCOUNTER — Encounter (HOSPITAL_COMMUNITY)
Admission: RE | Disposition: A | Discharge status: Home Health | Payer: Self-pay | Source: Ambulatory Visit | Attending: Orthopedic Surgery

## 2012-05-16 ENCOUNTER — Encounter (HOSPITAL_COMMUNITY): Payer: Self-pay | Admitting: Anesthesiology

## 2012-05-16 ENCOUNTER — Encounter (HOSPITAL_COMMUNITY): Payer: Self-pay | Admitting: Vascular Surgery

## 2012-05-16 ENCOUNTER — Inpatient Hospital Stay (HOSPITAL_COMMUNITY): Payer: BC Managed Care – PPO | Admitting: Anesthesiology

## 2012-05-16 ENCOUNTER — Inpatient Hospital Stay (HOSPITAL_COMMUNITY)
Admission: RE | Admit: 2012-05-16 | Discharge: 2012-05-18 | DRG: 818 | Disposition: A | Discharge status: Home Health | Payer: BC Managed Care – PPO | Source: Ambulatory Visit | Attending: Orthopedic Surgery | Admitting: Orthopedic Surgery

## 2012-05-16 DIAGNOSIS — M1612 Unilateral primary osteoarthritis, left hip: Secondary | ICD-10-CM | POA: Diagnosis present

## 2012-05-16 DIAGNOSIS — Z01810 Encounter for preprocedural cardiovascular examination: Secondary | ICD-10-CM

## 2012-05-16 DIAGNOSIS — M1610 Unilateral primary osteoarthritis, unspecified hip: Principal | ICD-10-CM | POA: Diagnosis present

## 2012-05-16 DIAGNOSIS — G4733 Obstructive sleep apnea (adult) (pediatric): Secondary | ICD-10-CM | POA: Diagnosis present

## 2012-05-16 DIAGNOSIS — Z79899 Other long term (current) drug therapy: Secondary | ICD-10-CM

## 2012-05-16 DIAGNOSIS — I509 Heart failure, unspecified: Secondary | ICD-10-CM | POA: Diagnosis present

## 2012-05-16 DIAGNOSIS — Z01818 Encounter for other preprocedural examination: Secondary | ICD-10-CM

## 2012-05-16 DIAGNOSIS — E119 Type 2 diabetes mellitus without complications: Secondary | ICD-10-CM | POA: Diagnosis present

## 2012-05-16 DIAGNOSIS — E785 Hyperlipidemia, unspecified: Secondary | ICD-10-CM | POA: Diagnosis present

## 2012-05-16 DIAGNOSIS — I10 Essential (primary) hypertension: Secondary | ICD-10-CM | POA: Diagnosis present

## 2012-05-16 DIAGNOSIS — Z6841 Body Mass Index (BMI) 40.0 and over, adult: Secondary | ICD-10-CM

## 2012-05-16 DIAGNOSIS — Z96649 Presence of unspecified artificial hip joint: Secondary | ICD-10-CM

## 2012-05-16 DIAGNOSIS — Z01812 Encounter for preprocedural laboratory examination: Secondary | ICD-10-CM

## 2012-05-16 HISTORY — PX: TOTAL HIP ARTHROPLASTY: SHX124

## 2012-05-16 SURGERY — ARTHROPLASTY, HIP, TOTAL,POSTERIOR APPROACH
Anesthesia: General | Site: Hip | Laterality: Left | Wound class: Clean

## 2012-05-16 MED ORDER — SODIUM CHLORIDE 0.9 % IV SOLN
INTRAVENOUS | Status: DC
  Administered 2012-05-16: 16:00:00 via INTRAVENOUS

## 2012-05-16 MED ORDER — BUPIVACAINE-EPINEPHRINE 0.5% -1:200000 IJ SOLN
INTRAMUSCULAR | Status: DC | PRN
  Administered 2012-05-16: 20 mL

## 2012-05-16 MED ORDER — MIDAZOLAM HCL 5 MG/5ML IJ SOLN
INTRAMUSCULAR | Status: DC | PRN
  Administered 2012-05-16: 1 mg via INTRAVENOUS

## 2012-05-16 MED ORDER — METFORMIN HCL 500 MG PO TABS
1000.0000 mg | ORAL_TABLET | Freq: Two times a day (BID) | ORAL | Status: DC
  Administered 2012-05-16 – 2012-05-18 (×4): 1000 mg via ORAL
  Filled 2012-05-16 (×7): qty 2

## 2012-05-16 MED ORDER — LACTATED RINGERS IV SOLN
INTRAVENOUS | Status: DC | PRN
  Administered 2012-05-16 (×3): via INTRAVENOUS

## 2012-05-16 MED ORDER — PROPOFOL 10 MG/ML IV BOLUS
INTRAVENOUS | Status: DC | PRN
  Administered 2012-05-16: 200 mg via INTRAVENOUS

## 2012-05-16 MED ORDER — POVIDONE-IODINE 7.5 % EX SOLN
Freq: Once | CUTANEOUS | Status: DC
  Filled 2012-05-16: qty 118

## 2012-05-16 MED ORDER — ATORVASTATIN CALCIUM 40 MG PO TABS
40.0000 mg | ORAL_TABLET | Freq: Every day | ORAL | Status: DC
  Administered 2012-05-16 – 2012-05-17 (×2): 40 mg via ORAL
  Filled 2012-05-16 (×3): qty 1

## 2012-05-16 MED ORDER — INSULIN ASPART 100 UNIT/ML ~~LOC~~ SOLN
0.0000 [IU] | Freq: Three times a day (TID) | SUBCUTANEOUS | Status: DC
  Administered 2012-05-16: 5 [IU] via SUBCUTANEOUS
  Administered 2012-05-17: 3 [IU] via SUBCUTANEOUS
  Administered 2012-05-17: 2 [IU] via SUBCUTANEOUS
  Administered 2012-05-17 – 2012-05-18 (×3): 3 [IU] via SUBCUTANEOUS

## 2012-05-16 MED ORDER — ASPIRIN EC 325 MG PO TBEC
325.0000 mg | DELAYED_RELEASE_TABLET | Freq: Two times a day (BID) | ORAL | Status: DC
  Administered 2012-05-16 – 2012-05-18 (×4): 325 mg via ORAL
  Filled 2012-05-16 (×7): qty 1

## 2012-05-16 MED ORDER — ONDANSETRON HCL 4 MG PO TABS: 4.0000 mg | ORAL_TABLET | Freq: Four times a day (QID) | ORAL | Status: DC | PRN

## 2012-05-16 MED ORDER — HYDROMORPHONE HCL PF 1 MG/ML IJ SOLN: 0.2500 mg | INTRAMUSCULAR | Status: DC | PRN

## 2012-05-16 MED ORDER — ROCURONIUM BROMIDE 100 MG/10ML IV SOLN
INTRAVENOUS | Status: DC | PRN
  Administered 2012-05-16: 50 mg via INTRAVENOUS

## 2012-05-16 MED ORDER — ACETAMINOPHEN 10 MG/ML IV SOLN
INTRAVENOUS | Status: DC | PRN
  Administered 2012-05-16: 1000 mg via INTRAVENOUS

## 2012-05-16 MED ORDER — PHENOL 1.4 % MT LIQD: 1.0000 | OROMUCOSAL | Status: DC | PRN

## 2012-05-16 MED ORDER — ACETAMINOPHEN 10 MG/ML IV SOLN
INTRAVENOUS | Status: AC
  Filled 2012-05-16: qty 100

## 2012-05-16 MED ORDER — METHOCARBAMOL 100 MG/ML IJ SOLN
500.0000 mg | Freq: Four times a day (QID) | INTRAVENOUS | Status: DC | PRN
  Filled 2012-05-16: qty 5

## 2012-05-16 MED ORDER — LIDOCAINE HCL (CARDIAC) 20 MG/ML IV SOLN
INTRAVENOUS | Status: DC | PRN
  Administered 2012-05-16: 100 mg via INTRAVENOUS

## 2012-05-16 MED ORDER — METOCLOPRAMIDE HCL 5 MG/ML IJ SOLN
INTRAMUSCULAR | Status: AC
  Administered 2012-05-16: 10 mg via INTRAVENOUS
  Filled 2012-05-16: qty 2

## 2012-05-16 MED ORDER — AMLODIPINE BESYLATE-VALSARTAN 10-320 MG PO TABS: 1.0000 | ORAL_TABLET | Freq: Every day | ORAL | Status: DC

## 2012-05-16 MED ORDER — ONDANSETRON HCL 4 MG/2ML IJ SOLN: 4.0000 mg | Freq: Four times a day (QID) | INTRAMUSCULAR | Status: DC | PRN

## 2012-05-16 MED ORDER — SUCCINYLCHOLINE CHLORIDE 20 MG/ML IJ SOLN
INTRAMUSCULAR | Status: DC | PRN
  Administered 2012-05-16: 120 mg via INTRAVENOUS

## 2012-05-16 MED ORDER — ONDANSETRON HCL 4 MG/2ML IJ SOLN
INTRAMUSCULAR | Status: DC | PRN
  Administered 2012-05-16: 4 mg via INTRAVENOUS

## 2012-05-16 MED ORDER — METOCLOPRAMIDE HCL 5 MG PO TABS
5.0000 mg | ORAL_TABLET | Freq: Three times a day (TID) | ORAL | Status: DC | PRN
  Filled 2012-05-16: qty 2

## 2012-05-16 MED ORDER — SODIUM CHLORIDE 0.9 % IR SOLN
Status: DC | PRN
  Administered 2012-05-16: 1000 mL

## 2012-05-16 MED ORDER — ADULT MULTIVITAMIN W/MINERALS CH
1.0000 | ORAL_TABLET | Freq: Every day | ORAL | Status: DC
  Administered 2012-05-16 – 2012-05-18 (×3): 1 via ORAL
  Filled 2012-05-16 (×3): qty 1

## 2012-05-16 MED ORDER — HYDROMORPHONE HCL PF 1 MG/ML IJ SOLN
1.0000 mg | INTRAMUSCULAR | Status: DC | PRN
  Administered 2012-05-16 – 2012-05-17 (×3): 1 mg via INTRAVENOUS
  Filled 2012-05-16 (×4): qty 1

## 2012-05-16 MED ORDER — POLYETHYLENE GLYCOL 3350 17 G PO PACK: 17.0000 g | PACK | Freq: Every day | ORAL | Status: DC | PRN

## 2012-05-16 MED ORDER — LACTATED RINGERS IV SOLN
INTRAVENOUS | Status: DC
  Administered 2012-05-16: 09:00:00 via INTRAVENOUS

## 2012-05-16 MED ORDER — KETOROLAC TROMETHAMINE 15 MG/ML IJ SOLN
15.0000 mg | Freq: Four times a day (QID) | INTRAMUSCULAR | Status: AC
  Administered 2012-05-16 – 2012-05-17 (×4): 15 mg via INTRAVENOUS
  Filled 2012-05-16 (×4): qty 1

## 2012-05-16 MED ORDER — METHOCARBAMOL 500 MG PO TABS
500.0000 mg | ORAL_TABLET | Freq: Four times a day (QID) | ORAL | Status: DC | PRN
  Administered 2012-05-17 – 2012-05-18 (×4): 500 mg via ORAL
  Filled 2012-05-16 (×4): qty 1

## 2012-05-16 MED ORDER — ARTIFICIAL TEARS OP OINT
TOPICAL_OINTMENT | OPHTHALMIC | Status: DC | PRN
  Administered 2012-05-16: 1 via OPHTHALMIC

## 2012-05-16 MED ORDER — SITAGLIPTIN PHOS-METFORMIN HCL 50-1000 MG PO TABS: 1.0000 | ORAL_TABLET | Freq: Two times a day (BID) | ORAL | Status: DC

## 2012-05-16 MED ORDER — AMLODIPINE BESYLATE 10 MG PO TABS
10.0000 mg | ORAL_TABLET | Freq: Every day | ORAL | Status: DC
  Administered 2012-05-16 – 2012-05-18 (×3): 10 mg via ORAL
  Filled 2012-05-16 (×3): qty 1

## 2012-05-16 MED ORDER — FENTANYL CITRATE 0.05 MG/ML IJ SOLN
INTRAMUSCULAR | Status: DC | PRN
  Administered 2012-05-16: 100 ug via INTRAVENOUS
  Administered 2012-05-16: 50 ug via INTRAVENOUS
  Administered 2012-05-16: 100 ug via INTRAVENOUS

## 2012-05-16 MED ORDER — GLIMEPIRIDE 4 MG PO TABS
4.0000 mg | ORAL_TABLET | Freq: Two times a day (BID) | ORAL | Status: DC
  Administered 2012-05-16 – 2012-05-18 (×4): 4 mg via ORAL
  Filled 2012-05-16 (×6): qty 1

## 2012-05-16 MED ORDER — OXYCODONE HCL 5 MG/5ML PO SOLN: 5.0000 mg | Freq: Once | ORAL | Status: DC | PRN

## 2012-05-16 MED ORDER — OXYCODONE HCL 5 MG PO TABS
5.0000 mg | ORAL_TABLET | Freq: Once | ORAL | Status: DC | PRN
Start: 2012-05-16 — End: 2012-05-16

## 2012-05-16 MED ORDER — CEFAZOLIN SODIUM-DEXTROSE 2-3 GM-% IV SOLR
2.0000 g | Freq: Four times a day (QID) | INTRAVENOUS | Status: AC
  Administered 2012-05-16 – 2012-05-17 (×2): 2 g via INTRAVENOUS
  Filled 2012-05-16 (×2): qty 50

## 2012-05-16 MED ORDER — OXYCODONE HCL 5 MG PO TABS
5.0000 mg | ORAL_TABLET | ORAL | Status: DC | PRN
  Administered 2012-05-17 – 2012-05-18 (×8): 10 mg via ORAL
  Filled 2012-05-16 (×9): qty 2

## 2012-05-16 MED ORDER — ALUM & MAG HYDROXIDE-SIMETH 200-200-20 MG/5ML PO SUSP: 30.0000 mL | ORAL | Status: DC | PRN

## 2012-05-16 MED ORDER — MEPERIDINE HCL 25 MG/ML IJ SOLN: 6.2500 mg | INTRAMUSCULAR | Status: DC | PRN

## 2012-05-16 MED ORDER — GLYCOPYRROLATE 0.2 MG/ML IJ SOLN
INTRAMUSCULAR | Status: DC | PRN
  Administered 2012-05-16: 0.4 mg via INTRAVENOUS

## 2012-05-16 MED ORDER — METOCLOPRAMIDE HCL 5 MG/ML IJ SOLN: 5.0000 mg | Freq: Three times a day (TID) | INTRAMUSCULAR | Status: DC | PRN

## 2012-05-16 MED ORDER — ZOLPIDEM TARTRATE 5 MG PO TABS: 5.0000 mg | ORAL_TABLET | Freq: Every evening | ORAL | Status: DC | PRN

## 2012-05-16 MED ORDER — KETOROLAC TROMETHAMINE 30 MG/ML IJ SOLN
INTRAMUSCULAR | Status: DC | PRN
  Administered 2012-05-16: 30 mg via INTRAVENOUS

## 2012-05-16 MED ORDER — NEOSTIGMINE METHYLSULFATE 1 MG/ML IJ SOLN
INTRAMUSCULAR | Status: DC | PRN
  Administered 2012-05-16: 3 mg via INTRAVENOUS

## 2012-05-16 MED ORDER — DOCUSATE SODIUM 100 MG PO CAPS
100.0000 mg | ORAL_CAPSULE | Freq: Two times a day (BID) | ORAL | Status: DC
  Administered 2012-05-16 – 2012-05-18 (×4): 100 mg via ORAL
  Filled 2012-05-16 (×5): qty 1

## 2012-05-16 MED ORDER — VALSARTAN 160 MG PO TABS
320.0000 mg | ORAL_TABLET | Freq: Every day | ORAL | Status: DC
  Administered 2012-05-16 – 2012-05-18 (×3): 320 mg via ORAL
  Filled 2012-05-16 (×3): qty 2

## 2012-05-16 MED ORDER — PROMETHAZINE HCL 25 MG/ML IJ SOLN: 6.2500 mg | INTRAMUSCULAR | Status: DC | PRN

## 2012-05-16 MED ORDER — FERROUS SULFATE 325 (65 FE) MG PO TABS
325.0000 mg | ORAL_TABLET | Freq: Two times a day (BID) | ORAL | Status: DC
  Administered 2012-05-16 – 2012-05-18 (×4): 325 mg via ORAL
  Filled 2012-05-16 (×6): qty 1

## 2012-05-16 MED ORDER — ACETAMINOPHEN 10 MG/ML IV SOLN
1000.0000 mg | Freq: Four times a day (QID) | INTRAVENOUS | Status: AC
  Administered 2012-05-16 – 2012-05-17 (×4): 1000 mg via INTRAVENOUS
  Filled 2012-05-16 (×4): qty 100

## 2012-05-16 MED ORDER — MENTHOL 3 MG MT LOZG
1.0000 | LOZENGE | OROMUCOSAL | Status: DC | PRN
  Administered 2012-05-16: 3 mg via ORAL
  Filled 2012-05-16 (×2): qty 9

## 2012-05-16 MED ORDER — SITAGLIPTIN PHOSPHATE 50 MG PO TABS
50.0000 mg | ORAL_TABLET | Freq: Two times a day (BID) | ORAL | Status: DC
  Administered 2012-05-16 – 2012-05-18 (×4): 50 mg via ORAL
  Filled 2012-05-16 (×7): qty 1

## 2012-05-16 MED ORDER — FUROSEMIDE 20 MG PO TABS
20.0000 mg | ORAL_TABLET | Freq: Every day | ORAL | Status: DC
  Administered 2012-05-16 – 2012-05-18 (×3): 20 mg via ORAL
  Filled 2012-05-16 (×4): qty 1

## 2012-05-16 SURGICAL SUPPLY — 56 items
BLADE SAW SAG 73X25 THK (BLADE) ×1
BLADE SAW SGTL 73X25 THK (BLADE) ×1 IMPLANT
BRUSH FEMORAL CANAL (MISCELLANEOUS) IMPLANT
CLOTH BEACON ORANGE TIMEOUT ST (SAFETY) ×2 IMPLANT
COVER BACK TABLE 24X17X13 BIG (DRAPES) IMPLANT
COVER SURGICAL LIGHT HANDLE (MISCELLANEOUS) ×2 IMPLANT
DRAPE ORTHO SPLIT 77X108 STRL (DRAPES) ×4
DRAPE PROXIMA HALF (DRAPES) ×2 IMPLANT
DRAPE SURG ORHT 6 SPLT 77X108 (DRAPES) ×2 IMPLANT
DRAPE U-SHAPE 47X51 STRL (DRAPES) ×2 IMPLANT
DRILL BIT 7/64X5 (BIT) ×2 IMPLANT
DRSG MEPILEX BORDER 4X12 (GAUZE/BANDAGES/DRESSINGS) ×2 IMPLANT
DURAPREP 26ML APPLICATOR (WOUND CARE) ×2 IMPLANT
ELECT BLADE 6.5 EXT (BLADE) ×1 IMPLANT
ELECT CAUTERY BLADE 6.4 (BLADE) ×2 IMPLANT
ELECT REM PT RETURN 9FT ADLT (ELECTROSURGICAL) ×2
ELECTRODE REM PT RTRN 9FT ADLT (ELECTROSURGICAL) ×1 IMPLANT
EVACUATOR 1/8 PVC DRAIN (DRAIN) IMPLANT
GLOVE BIOGEL PI IND STRL 8 (GLOVE) ×2 IMPLANT
GLOVE BIOGEL PI INDICATOR 8 (GLOVE) ×2
GLOVE ECLIPSE 7.5 STRL STRAW (GLOVE) ×4 IMPLANT
GOWN PREVENTION PLUS XLARGE (GOWN DISPOSABLE) ×2 IMPLANT
GOWN SRG XL XLNG 56XLVL 4 (GOWN DISPOSABLE) ×1 IMPLANT
GOWN STRL NON-REIN LRG LVL3 (GOWN DISPOSABLE) ×2 IMPLANT
GOWN STRL NON-REIN XL XLG LVL4 (GOWN DISPOSABLE) ×2
HOOD PEEL AWAY FACE SHEILD DIS (HOOD) ×6 IMPLANT
IMMOBILIZER KNEE 20 (SOFTGOODS)
IMMOBILIZER KNEE 20 THIGH 36 (SOFTGOODS) IMPLANT
IMMOBILIZER KNEE 22 UNIV (SOFTGOODS) ×2 IMPLANT
IMMOBILIZER KNEE 24 THIGH 36 (MISCELLANEOUS) IMPLANT
IMMOBILIZER KNEE 24 UNIV (MISCELLANEOUS)
KIT BASIN OR (CUSTOM PROCEDURE TRAY) ×2 IMPLANT
KIT ROOM TURNOVER OR (KITS) ×2 IMPLANT
MANIFOLD NEPTUNE II (INSTRUMENTS) ×2 IMPLANT
NDL 1/2 CIR MAYO (NEEDLE) ×1 IMPLANT
NEEDLE 1/2 CIR MAYO (NEEDLE) ×2 IMPLANT
NEEDLE 22X1 1/2 (OR ONLY) (NEEDLE) ×2 IMPLANT
NS IRRIG 1000ML POUR BTL (IV SOLUTION) ×2 IMPLANT
PACK TOTAL JOINT (CUSTOM PROCEDURE TRAY) ×2 IMPLANT
PAD ARMBOARD 7.5X6 YLW CONV (MISCELLANEOUS) ×4 IMPLANT
PASSER SUT SWANSON 36MM LOOP (INSTRUMENTS) IMPLANT
PRESSURIZER FEMORAL UNIV (MISCELLANEOUS) IMPLANT
STAPLER VISISTAT 35W (STAPLE) ×2 IMPLANT
SUCTION FRAZIER TIP 10 FR DISP (SUCTIONS) ×1 IMPLANT
SUT ETHIBOND 2 V 37 (SUTURE) ×2 IMPLANT
SUT VIC AB 0 CTXB 36 (SUTURE) ×4 IMPLANT
SUT VIC AB 1 CTX 27 (SUTURE) ×1 IMPLANT
SUT VIC AB 1 CTX 36 (SUTURE) ×4
SUT VIC AB 1 CTX36XBRD ANBCTR (SUTURE) ×2 IMPLANT
SUT VIC AB 2-0 CTB1 (SUTURE) ×5 IMPLANT
SYR CONTROL 10ML LL (SYRINGE) ×2 IMPLANT
TOWEL OR 17X24 6PK STRL BLUE (TOWEL DISPOSABLE) ×2 IMPLANT
TOWEL OR 17X26 10 PK STRL BLUE (TOWEL DISPOSABLE) ×2 IMPLANT
TOWER CARTRIDGE SMART MIX (DISPOSABLE) IMPLANT
TRAY FOLEY CATH 14FR (SET/KITS/TRAYS/PACK) ×1 IMPLANT
WATER STERILE IRR 1000ML POUR (IV SOLUTION) ×5 IMPLANT

## 2012-05-16 NOTE — H&P (Signed)
TOTAL HIP ADMISSION H&P  Patient is admitted for left total hip arthroplasty.  Subjective:  Chief Complaint: left hip pain  HPI: [name], 57 y.o. male, has a history of pain and functional disability in the left hip(s) due to arthritis and patient has failed non-surgical conservative treatments for greater than 12 weeks to include NSAID's and/or analgesics, supervised PT with diminished ADL's post treatment, use of assistive devices, weight reduction as appropriate and activity modification.  Onset of symptoms was gradual starting 3 years ago with rapidlly worsening course since that time.The patient noted no past surgery on the left hip(s).  Patient currently rates pain in the left hip at 8 out of 10 with activity. Patient has night pain, worsening of pain with activity and weight bearing, pain that interfers with activities of daily living and pain with passive range of motion. Patient has evidence of joint subluxation and joint space narrowing by imaging studies. This condition presents safety issues increasing the risk of falls. This patient has had failure of conservative care.  There is no current active infection.  There are no active problems to display for this patient.  Past Medical History  Diagnosis Date  . Shortness of breath   . Sleep apnea     uses bipap  . Hypertension   . CHF (congestive heart failure)   . Arthritis   . Hyperlipemia   . Diabetes mellitus     Past Surgical History  Procedure Laterality Date  . Joint replacement  2006    rt hip  . Achilles tendon surgery      lt  . Knee arthroscopy      lt  . Tonsillectomy    . Colonoscopy    . Rotator cuff repair Left 03-26-2011    Prescriptions prior to admission  Medication Sig Dispense Refill  . amLODipine-valsartan (EXFORGE) 10-320 MG per tablet Take 1 tablet by mouth daily.      Marland Kitchen atorvastatin (LIPITOR) 40 MG tablet Take 40 mg by mouth daily.      . celecoxib (CELEBREX) 200 MG capsule Take 200 mg by  mouth 2 (two) times daily.      . furosemide (LASIX) 20 MG tablet Take 20 mg by mouth daily.       Marland Kitchen glimepiride (AMARYL) 4 MG tablet Take 4 mg by mouth 2 (two) times daily.      . Multiple Vitamin (MULTIVITAMIN WITH MINERALS) TABS Take 1 tablet by mouth daily.      . sitaGLIPtan-metformin (JANUMET) 50-1000 MG per tablet Take 1 tablet by mouth 2 (two) times daily with a meal.       Allergies  Allergen Reactions  . Strawberry Rash    History  Substance Use Topics  . Smoking status: Never Smoker   . Smokeless tobacco: Not on file  . Alcohol Use: Yes     Comment: rarely    History reviewed. No pertinent family history.   ROS  Objective:  Physical Exam  Vital signs in last 24 hours: Temp:  [97.6 F (36.4 C)] 97.6 F (36.4 C) (03/28 0816) Pulse Rate:  [71] 71 (03/28 0816) Resp:  [20] 20 (03/28 0816) BP: (154)/(72) 154/72 mmHg (03/28 0816) SpO2:  [96 %] 96 % (03/28 0816)  Labs:   Estimated body mass index is 45.63 kg/(m^2) as calculated from the following:   Height as of 05/09/12: 5\' 8"  (1.727 m).   Weight as of 10/17/11: 136.079 kg (300 lb).   Imaging Review Plain radiographs demonstrate severe  degenerative joint disease of the left hip(s). The bone quality appears to be satisfactory for age and reported activity level.  Assessment/Plan:  End stage arthritis, left hip(s)  The patient history, physical examination, clinical judgement of the provider and imaging studies are consistent with end stage degenerative joint disease of the left hip(s) and total hip arthroplasty is deemed medically necessary. The treatment options including medical management, injection therapy, arthroscopy and arthroplasty were discussed at length. The risks and benefits of total hip arthroplasty were presented and reviewed. The risks due to aseptic loosening, infection, stiffness, dislocation/subluxation,  thromboembolic complications and other imponderables were discussed.  The patient acknowledged  the explanation, agreed to proceed with the plan and consent was signed. Patient is being admitted for inpatient treatment for surgery, pain control, PT, OT, prophylactic antibiotics, VTE prophylaxis, progressive ambulation and ADL's and discharge planning.The patient is planning to be discharged home with home health services

## 2012-05-16 NOTE — Progress Notes (Addendum)
[facility]  Patient Status: New  [facility] is providing the following services: PT - referral from MD office. Thank you!  Pt states he may be going home on Sunday.  Is on the schedule for a PT visit at home on Monday.  If patient discharges after hours, please call [phone].   Jodene Nam 05/16/2012, 5:13 PM

## 2012-05-16 NOTE — Progress Notes (Addendum)
SW will await PT/OT notes to determine discharge disposition.  Daimon Kean, MSW,  [phone]   

## 2012-05-16 NOTE — Transfer of Care (Signed)
Immediate Anesthesia Transfer of Care Note  Patient: [name]  Procedure(s) Performed: Procedure(s): TOTAL HIP ARTHROPLASTY (Left)  Patient Location: PACU  Anesthesia Type:General  Level of Consciousness: awake, alert  and oriented  Airway & Oxygen Therapy: Patient Spontanous Breathing and Patient connected to face mask oxygen  Post-op Assessment: Report given to PACU RN  Post vital signs: Reviewed and stable  Complications: No apparent anesthesia complications

## 2012-05-16 NOTE — Progress Notes (Signed)
Orthopedic Tech Progress Note Patient Details:  [name] [birth year] [id number] Applied overhead to bed.     Jennye Moccasin 05/16/2012, 4:08 PM

## 2012-05-16 NOTE — Plan of Care (Signed)
Problem: Consults Goal: Diagnosis- Total Joint Replacement Primary Total Hip Left     

## 2012-05-16 NOTE — Anesthesia Postprocedure Evaluation (Signed)
  Anesthesia Post-op Note  Patient: [name]  Procedure(s) Performed: Procedure(s): TOTAL HIP ARTHROPLASTY (Left)  Patient Location: PACU  Anesthesia Type:General  Level of Consciousness: awake  Airway and Oxygen Therapy: Patient Spontanous Breathing  Post-op Pain: mild  Post-op Assessment: Post-op Vital signs reviewed  Post-op Vital Signs: Reviewed  Complications: No apparent anesthesia complications

## 2012-05-16 NOTE — Evaluation (Signed)
Physical Therapy Evaluation Patient Details Name: [name] MRN: [id number] DOB: 1955-04-04 Today's Date: 05/16/2012 Time: 6295-2841 PT Time Calculation (min): 23 min  PT Assessment / Plan / Recommendation Clinical Impression  Patient is a 57 yo male s/p Lt. THA.  Able to sit at EOB with assist.  Presents with weakness, decreased ROM, and pain.  Will benefit from acute PT to maximize independence prior to return home with wife.  Recommend HHPT at discharge for continued therapy.    PT Assessment  Patient needs continued PT services    Follow Up Recommendations  Home health PT;Supervision/Assistance - 24 hour    Does the patient have the potential to tolerate intense rehabilitation      Barriers to Discharge        Equipment Recommendations  Rolling walker with 5" wheels (Has a standard walker - may need RW)    Recommendations for Other Services     Frequency 7X/week    Precautions / Restrictions Precautions Precautions: Posterior Hip Precaution Booklet Issued: Yes (comment) Precaution Comments: Reviewed precautions with patient and wife. Restrictions Weight Bearing Restrictions: Yes LLE Weight Bearing: Weight bearing as tolerated   Pertinent Vitals/Pain       Mobility  Bed Mobility Bed Mobility: Supine to Sit;Sitting - Scoot to [place] of Bed;Sit to Supine;Scooting to [facility] Supine to Sit: 1: +2 Total assist;HOB elevated Supine to Sit: Patient Percentage: 60% Sitting - Scoot to Edge of Bed: 3: Mod assist Sit to Supine: 3: Mod assist;HOB elevated Scooting to HOB: 3: Mod assist;With trapeze Details for Bed Mobility Assistance: Verbal cues for technique.  Assist to move LLE off of and onto bed, and to raise trunk off of bed.  Patient sat EOB x 8 minutes with good balance.  Patient became nauseated and lightheaded.  Returned to supine.  Placed bed in trendelenberg to assist patient moving to [facility] - using trapeze. Transfers Transfers: Not assessed    Exercises Total Joint  Exercises Ankle Circles/Pumps: AROM;Both;10 reps;Supine;Seated (total of 20 reps)   PT Diagnosis: Difficulty walking;Acute pain  PT Problem List: Decreased strength;Decreased range of motion;Decreased activity tolerance;Decreased mobility;Decreased knowledge of use of DME;Decreased knowledge of precautions;Obesity;Pain PT Treatment Interventions: DME instruction;Gait training;Stair training;Functional mobility training;Therapeutic exercise;Patient/family education   PT Goals Acute Rehab PT Goals PT Goal Formulation: With patient/family Time For Goal Achievement: 05/23/12 Potential to Achieve Goals: Good Pt will go Supine/Side to Sit: Independently;with HOB 0 degrees PT Goal: Supine/Side to Sit - Progress: Goal set today Pt will go Sit to Supine/Side: Independently;with HOB 0 degrees PT Goal: Sit to Supine/Side - Progress: Goal set today Pt will go Sit to Stand: with supervision;with upper extremity assist PT Goal: Sit to Stand - Progress: Goal set today Pt will Ambulate: 51 - 150 feet;with supervision;with rolling walker;with standard walker PT Goal: Ambulate - Progress: Goal set today Pt will Go Up / Down Stairs: 1-2 stairs;with min assist;with least restrictive assistive device PT Goal: Up/Down Stairs - Progress: Goal set today Pt will Perform Home Exercise Program: with supervision, verbal cues required/provided PT Goal: Perform Home Exercise Program - Progress: Goal set today  Visit Information  Last PT Received On: 05/16/12 Assistance Needed: +2    Subjective Data  Subjective: Patient joking with PT during session. Patient Stated Goal: To get home.   Prior Functioning  Home Living Lives With: Spouse Available Help at Discharge: Family;Available 24 hours/day (for several days at discharge.) Type of Home: House Home Access: Stairs to enter [employer] of Steps: 1 Entrance  Stairs-Rails: None Home Layout: One level Bathroom Shower/Tub: Agricultural engineer: Standard Bathroom Accessibility: Yes How Accessible: Accessible via walker Home Adaptive Equipment: Bedside commode/3-in-1;Shower chair with back;Walker - standard;Crutches Prior Function Level of Independence: Independent Able to Take Stairs?: Yes Driving: Yes Vocation: Unemployed (Taking time to address health issues per patient) Communication Communication: No difficulties    Cognition  Cognition Overall Cognitive Status: Appears within functional limits for tasks assessed/performed Arousal/Alertness: Awake/alert Orientation Level: Oriented X4 / Intact Behavior During Session: [facility] for tasks performed    Extremity/Trunk Assessment Right Upper Extremity Assessment RUE ROM/Strength/Tone: WFL for tasks assessed RUE Sensation: WFL - Light Touch Left Upper Extremity Assessment LUE ROM/Strength/Tone: WFL for tasks assessed LUE Sensation: WFL - Light Touch Right Lower Extremity Assessment RLE ROM/Strength/Tone: WFL for tasks assessed RLE Sensation: WFL - Light Touch Left Lower Extremity Assessment LLE ROM/Strength/Tone: Deficits;Unable to fully assess;Due to pain;Due to precautions LLE ROM/Strength/Tone Deficits: Able to assist with moving LLE off of and onto bed.   Balance Balance Balance Assessed: Yes Static Sitting Balance Static Sitting - Balance Support: No upper extremity supported;Feet supported Static Sitting - Level of Assistance: 5: Stand by assistance Static Sitting - Comment/# of Minutes: 8 minutes.  Limited by nausea  End of Session PT - End of Session Equipment Utilized During Treatment: Oxygen Activity Tolerance: Patient limited by fatigue;Patient limited by pain (Limited by nausea) Patient left: in bed;with call bell/phone within reach;with family/visitor present Nurse Communication: Mobility status  GP     Vena Austria 05/16/2012, 6:59 PM Durenda Hurt. Renaldo Fiddler, [facility] Acute Rehab Services Pager [phone]

## 2012-05-16 NOTE — Brief Op Note (Signed)
05/16/2012  1:26 PM  PATIENT:  [name]  57 y.o. male  PRE-OPERATIVE DIAGNOSIS:  degenerative joint disease  POST-OPERATIVE DIAGNOSIS:  degenerative joint disease  PROCEDURE:  Procedure(s): TOTAL HIP ARTHROPLASTY (Left)  SURGEON:  Surgeon(s) and Role:    * Harvie Junior, MD - Primary  PHYSICIAN ASSISTANT:   ASSISTANTS: bethune   ANESTHESIA:   general  EBL:  Total I/O In: 2000 [I.V.:2000] Out: 500 [Urine:200; Blood:300]  BLOOD ADMINISTERED:none  DRAINS: none   LOCAL MEDICATIONS USED:  MARCAINE     SPECIMEN:  No Specimen  DISPOSITION OF SPECIMEN:  N/A  COUNTS:  YES  TOURNIQUET:  * No tourniquets in log *  DICTATION: .Other Dictation: Dictation Number [id number]  PLAN OF CARE: Admit to inpatient   PATIENT DISPOSITION:  PACU - hemodynamically stable.   Delay start of Pharmacological VTE agent (>24hrs) due to surgical blood loss or risk of bleeding: no

## 2012-05-16 NOTE — Preoperative (Signed)
Beta Blockers   Reason not to administer Beta Blockers:Not Applicable 

## 2012-05-16 NOTE — Anesthesia Preprocedure Evaluation (Signed)
Anesthesia Evaluation  Patient identified by MRN, date of birth, ID band Patient awake    Reviewed: Allergy & Precautions, H&P , NPO status , Patient's Chart, lab work & pertinent test results  Airway Mallampati: I  Neck ROM: Full    Dental  (+) Teeth Intact   Pulmonary shortness of breath, sleep apnea ,  breath sounds clear to auscultation        Cardiovascular hypertension, Pt. on medications +CHF Rhythm:Regular Rate:Normal     Neuro/Psych    GI/Hepatic Neg liver ROS,   Endo/Other  diabetes, Well Controlled, Oral Hypoglycemic AgentsMorbid obesity  Renal/GU negative Renal ROS     Musculoskeletal  (+) Arthritis -,   Abdominal (+) + obese,   Peds  Hematology negative hematology ROS (+)   Anesthesia Other Findings   Reproductive/Obstetrics                           Anesthesia Physical Anesthesia Plan  ASA: III  Anesthesia Plan: General   Post-op Pain Management:    Induction: Intravenous  Airway Management Planned: Oral ETT  Additional Equipment:   Intra-op Plan:   Post-operative Plan: Extubation in OR  Informed Consent: I have reviewed the patients History and Physical, chart, labs and discussed the procedure including the risks, benefits and alternatives for the proposed anesthesia with the patient or authorized representative who has indicated his/her understanding and acceptance.   Dental advisory given  Plan Discussed with: CRNA  Anesthesia Plan Comments:         Anesthesia Quick Evaluation

## 2012-05-17 LAB — BASIC METABOLIC PANEL
CO2: 29 mEq/L (ref 19–32)
Calcium: 8.4 mg/dL (ref 8.4–10.5)
Creatinine, Ser: 0.91 mg/dL (ref 0.50–1.35)
Glucose, Bld: 155 mg/dL — ABNORMAL HIGH (ref 70–99)

## 2012-05-17 LAB — CBC
Hemoglobin: 11.6 g/dL — ABNORMAL LOW (ref 13.0–17.0)
MCH: 28.1 pg (ref 26.0–34.0)
MCHC: 33.4 g/dL (ref 30.0–36.0)
MCV: 84 fL (ref 78.0–100.0)
Platelets: 247 10*3/uL (ref 150–400)
RBC: 4.13 MIL/uL — ABNORMAL LOW (ref 4.22–5.81)

## 2012-05-17 LAB — GLUCOSE, CAPILLARY
Glucose-Capillary: 148 mg/dL — ABNORMAL HIGH (ref 70–99)
Glucose-Capillary: 199 mg/dL — ABNORMAL HIGH (ref 70–99)
Glucose-Capillary: 189 mg/dL — ABNORMAL HIGH (ref 70–99)
Glucose-Capillary: 213 mg/dL — ABNORMAL HIGH (ref 70–99)

## 2012-05-17 NOTE — Progress Notes (Signed)
PT Treatment note  **patient O2 sats decreased to 86% during ambulation.   05/17/12 1538  PT Visit Information  Last PT Received On 05/17/12  Assistance Needed +1  PT Time Calculation  PT Start Time 1450  PT Stop Time 1502  PT Time Calculation (min) 12 min  Precautions  Precautions Posterior Hip  Required Braces or Orthoses Knee Immobilizer - Left  Knee Immobilizer - Left On when out of bed or walking  Restrictions  LLE Weight Bearing WBAT  Cognition  Overall Cognitive Status Appears within functional limits for tasks assessed/performed  Arousal/Alertness Awake/alert  Orientation Level Appears intact for tasks assessed  Behavior During Session [facility] for tasks performed  Transfers  Sit to Stand 4: Min guard;With upper extremity assist;From chair/3-in-1  Stand to Sit 4: Min guard;With upper extremity assist;To chair/3-in-1  Ambulation/Gait  Ambulation/Gait Assistance 5: Supervision  Ambulation Distance (Feet) 100 Feet  Assistive device Rolling walker  Ambulation/Gait Assistance Details ambulation limited by decreased o2 sats.  Gait Pattern Step-to pattern  Exercises  Exercises Total Joint  Total Joint Exercises  Ankle Circles/Pumps AROM;Both;10 reps;Seated  Quad Sets AROM;Left;10 reps;Seated  Long Arc Quad AROM;Left;10 reps;Seated  PT - End of Session  Equipment Utilized During Treatment Gait belt  Activity Tolerance Treatment limited secondary to medical complications (Comment)  Patient left in chair;with call bell/phone within reach  PT - Assessment/Plan  Comments on Treatment Session patient continues to improve with mobility.  Patient continues to be limited by decreased oxygen saturations.  Will need to continue to monitor closely.    PT Plan Discharge plan remains appropriate;Frequency remains appropriate  PT Frequency 7X/week  Follow Up Recommendations Home health PT;Supervision - Intermittent  PT equipment Rolling walker with 5" wheels  Acute Rehab PT Goals  PT  Goal: Sit to Stand - Progress Progressing toward goal  PT Goal: Ambulate - Progress Progressing toward goal  PT Goal: Perform Home Exercise Program - Progress Progressing toward goal  PT General Charges  $$ ACUTE PT VISIT 1 Procedure  PT Treatments  $Gait Training 8-22 mins  05/17/2012 [place], [place] [phone]

## 2012-05-17 NOTE — Progress Notes (Signed)
Patient on home cpap unit and needed O2 added. Placed O2 on patient CPAP at 3l. sats 96% with HR 84.

## 2012-05-17 NOTE — Op Note (Signed)
[name], [name]              ACCOUNT NO.:  [id number]  MEDICAL RECORD NO.:  [id number]  LOCATION:  [id number]                        FACILITY:  [facility]  PHYSICIAN:  Harvie Junior, M.D.   DATE OF BIRTH:  1955/09/07  DATE OF PROCEDURE:  05/16/2012 DATE OF DISCHARGE:                              OPERATIVE REPORT   PREOPERATIVE DIAGNOSIS: 1. End-stage degenerative joint disease, left hip. 2. Morbid obesity.  POSTOPERATIVE DIAGNOSIS: 1. End-stage degenerative joint disease, left hip. 2. Morbid obesity.  PROCEDURE: 1. Left total hip replacement with S-ROM system, size 2015 stem with a     20D small cone, a 36, +8 offset stem.  The cup is a 52 mm Pinnacle     cup with a +4 neutral liner, and a 36 mm +0 ceramic ball. 2. Total hip replacement in a morbidly obese patient.  SURGEON:  Harvie Junior, M.D.  ASSISTANT:  Marshia Ly, P.A.  ANESTHESIA:  General.  BRIEF HISTORY:  [name] is a 57 year old male with a long history having severe bilateral hip degenerative joint disease.  We did his right hip about 8 years ago, and he has done well with that.  He began having worsening pain in the last several years, finally because of continued complaints of night pain, light activity pain, limited range of motion.  He was also taken to the operating room for left total hip replacement.  Given his large size, we knew this would be difficult a case and felt a posterior approach would be the only reasonable option given his large size and muscular nature.  He was brought to the operating room for this procedure.  DESCRIPTION OF PROCEDURE:  The patient was brought to the operating room, and after adequate anesthesia was obtained with general anesthetic, the patient was brought to the operating table and then moved to the right lateral decubitus position.  All bony prominences were well padded.  There was significant difficulty getting him in position because of his large size, but  ultimately we were able to get him prepped and draped in the usual sterile fashion.  Following this, attention was turned to the left hip where an 18-inch incision was made for a posterior approach to the hip, because of his large size we had to use a dramatic long incision to be able to get anywhere near the depth that we would need to get into his hip.  Once this was done, we cut to about 3 inches of fat to get down to the IT band.  The IT band was identified and divided in line with its fibers.  The gluteus medius was finger fractured and deep Charnley retractor was put in place that was barely deep enough to get into the hip.  At this point, the hip was internally rotated, although difficulty was encountered in this, there was large layers of fat over the posterior area.  Ultimately, we were able to get some retractors in place, took down the short external rotators, piriformis, and posterior capsule and tagged these, and then dislocated the hip.  This took twice as long as a normal case and we had to use a very deep retractors to try  to hold fat and tissue out of the way so that we could see the posterior capsule.  Once this was done, the hip provisional neck cut was made and then retractors were put in place anteriorly and posteriorly.  A lipectomy was performed, again very difficult because of the depth of the hole, placing retractors were difficult.  We had to use Ray-Tec sponges with loop through the end of the retractors to try to get enough pull to get into the hip joint. Finally, once the acetabulum was exposed, we were able to sequentially ream up to a level of 51 mm.  We took a 52 to open up the phase and then put in a 52 mm Pinnacle cup and put this in 30 degrees of anteversion and 45 degrees of lateral opening.  Once this was done, a trial liner neutral was put in place.  Attention was turned to the stem side, again extreme difficulty getting the stem up into position to  ream.  We used a cookie cutter, used a canal finder and then lateralizer and ultimately reamed up the stem to 15 mm.  We took a 15.5 down and took a 16 down halfway.  Once this was done, we went up to the cone and reamed for a 20D small cone.  There was extreme difficulty getting the conical reamer and because of his large size even with this tremendous incision because of the depth that we had to get into this wound.  We had to scrub in an additional assistant to help retract fat out of the way, so he will get down into this wound.  Once this was done, we put a trial 20D small cone in with a 2015 stem, put in 10 degrees of anteversion to match the opposite side, and put +0 ball onto the trial reduction.  This gave [place] excellent fit and fill.  Range of motion stability were satisfied with this, removed all of the trial components.  Took the trial liner out, put the final poly and locked it in place.  Checked it, went back to the stem side, and put in a 20D small cone with at 2015 stem in 10 degrees of anteversion, +0 ball, ceramic neutral and once this was accomplished, we put another, did a trial reduction.  Actually with a +0 trial and then once that was, we were happy with that we did a ceramic 36 mm +0 ball.  Once this was done, the wound was copiously and thoroughly irrigated.  The case took [place] a little over 2 hours, which is twice the normal length of time for total hip, because of the morbid obesity and difficulty getting into the depths of this wound.  The IT band was closed at this point, the deep fascia was closed with 0 and 2-0 Vicryl, and the skin with skin staples.  Sterile compressive dressing was applied as well as knee immobilizer, and the patient was taken to recovery, and was noted to be in satisfactory condition.  Estimated blood loss for the procedure was 350 mL.     Harvie Junior, M.D.     Ranae Plumber  D:  05/16/2012  T:  05/17/2012  Job:  [id number]

## 2012-05-17 NOTE — Progress Notes (Signed)
Subjective: 1 Day Post-Op Procedure(s) (LRB): TOTAL HIP ARTHROPLASTY (Left) Patient reports pain as 3 on 0-10 scale.    Objective: Vital signs in last 24 hours: Temp:  [98 F (36.7 C)-98.5 F (36.9 C)] 98.5 F (36.9 C) (03/29 0538) Pulse Rate:  [54-69] 65 (03/29 0538) Resp:  [14-25] 18 (03/29 0538) BP: (94-151)/(39-49) 95/45 mmHg (03/29 0538) SpO2:  [95 %-100 %] 98 % (03/29 0538)  Intake/Output from previous day: 03/28 0701 - 03/29 0700 In: 2391.7 [P.O.:240; I.V.:2151.7] Out: 1850 [Urine:1550; Blood:300] Intake/Output this shift:     Recent Labs  05/17/12 0700  HGB 11.6*    Recent Labs  05/17/12 0700  WBC 11.8*  RBC 4.13*  HCT 34.7*  PLT 247    Recent Labs  05/17/12 0700  NA 137  K 4.0  CL 100  CO2 29  BUN 20  CREATININE 0.91  GLUCOSE 155*  CALCIUM 8.4   No results found for this basename: LABPT, INR,  in the last 72 hours  Neurovascular intact Sensation intact distally Intact pulses distally Dorsiflexion/Plantar flexion intact Incision: dressing C/D/I Compartment soft  Assessment/Plan: 1 Day Post-Op Procedure(s) (LRB): TOTAL HIP ARTHROPLASTY (Left) Plan: Advance diet Up with therapy Discharge home with home health in 1-2 days.  Stanly Si G 05/17/2012, 8:45 AM

## 2012-05-17 NOTE — Progress Notes (Signed)
Physical Therapy Treatment Patient Details Name: [name] MRN: [id number] DOB: 10-07-1955 Today's Date: 05/17/2012 Time: 1100-1115 PT Time Calculation (min): 15 min  PT Assessment / Plan / Recommendation Comments on Treatment Session  patient did much better today, more min assist for all mobility.  Patient recalled 2/3 hip precautions without cueing/ 3/3 with cueing.  Ambulation only limited by decreased O2 sats.  Feel if cardiopulm status improves, patient will be able to d/c Sun after PT.  Will need continued PT to progress independence.    Follow Up Recommendations  Home health PT;Supervision - Intermittent     Does the patient have the potential to tolerate intense rehabilitation     Barriers to Discharge        Equipment Recommendations  Rolling walker with 5" wheels    Recommendations for Other Services    Frequency 7X/week   Plan Discharge plan remains appropriate;Frequency remains appropriate    Precautions / Restrictions Precautions Precautions: Posterior Hip Precaution Comments: Reviewed precautions with patient and wife. Restrictions LLE Weight Bearing: Weight bearing as tolerated   Pertinent Vitals/Pain Patient reports 4/10 pain during therapy    Mobility  Bed Mobility Supine to Sit: 4: Min assist (for LE) Sitting - Scoot to Edge of Bed: 4: Min guard Transfers Transfers: Sit to Stand;Stand to Sit Sit to Stand: 4: Min assist;With upper extremity assist;From bed Stand to Sit: 4: Min guard;With upper extremity assist;To chair/3-in-1 Ambulation/Gait Ambulation/Gait Assistance: 5: Supervision Ambulation Distance (Feet): 50 Feet Assistive device: Rolling walker Ambulation/Gait Assistance Details: ambulation limited by decreased oxygen sats.  Returned to chair and returned to room to reapply oxygen. Gait Pattern: Step-to pattern    Exercises Total Joint Exercises Ankle Circles/Pumps: AROM;Both;10 reps;Seated   PT Diagnosis:    PT Problem List:   PT  Treatment Interventions:     PT Goals Acute Rehab PT Goals PT Goal: Supine/Side to Sit - Progress: Progressing toward goal PT Goal: Sit to Stand - Progress: Progressing toward goal PT Goal: Ambulate - Progress: Progressing toward goal  Visit Information  Last PT Received On: 05/17/12 Assistance Needed: +1    Subjective Data  Subjective: patient reports feeling much better today   Cognition  Cognition Overall Cognitive Status: Appears within functional limits for tasks assessed/performed Arousal/Alertness: Awake/alert Orientation Level: Appears intact for tasks assessed Behavior During Session: [facility] for tasks performed    Balance     End of Session PT - End of Session Equipment Utilized During Treatment: Gait belt Activity Tolerance: Treatment limited secondary to medical complications (Comment) Patient left: in chair;with call bell/phone within reach;with family/visitor present Nurse Communication: Other (comment) (decreased o2 sats)   GP     Olivia Canter, [place] [phone] 05/17/2012, 11:39 AM

## 2012-05-17 NOTE — Clinical Social Work Note (Signed)
CSW consult for SNF. PT currently recommending HHPT with intermittent supervision. CSW signing off as no other CSW needs identified.  Yves Fodor, MSW, LCSWA [phone] (Weekends 8:00am-4:30pm)    

## 2012-05-18 LAB — BASIC METABOLIC PANEL
CO2: 26 mEq/L (ref 19–32)
Calcium: 8.5 mg/dL (ref 8.4–10.5)
GFR calc non Af Amer: >90 mL/min (ref >90)
Sodium: 135 mEq/L (ref 135–145)

## 2012-05-18 LAB — CBC
MCH: 28.3 pg (ref 26.0–34.0)
MCHC: 33.3 g/dL (ref 30.0–36.0)
Platelets: 217 10*3/uL (ref 150–400)
RBC: 3.92 MIL/uL — ABNORMAL LOW (ref 4.22–5.81)

## 2012-05-18 MED ORDER — METHOCARBAMOL 500 MG PO TABS: 750.0000 mg | ORAL_TABLET | Freq: Three times a day (TID) | ORAL | Status: DC

## 2012-05-18 MED ORDER — ASPIRIN 325 MG PO TBEC: 325.0000 mg | DELAYED_RELEASE_TABLET | Freq: Two times a day (BID) | ORAL | Status: DC

## 2012-05-18 MED ORDER — ENOXAPARIN SODIUM 40 MG/0.4ML ~~LOC~~ SOLN
40.0000 mg | Freq: Once | SUBCUTANEOUS | Status: AC
  Administered 2012-05-18: 40 mg via SUBCUTANEOUS
  Filled 2012-05-18: qty 0.4

## 2012-05-18 MED ORDER — OXYCODONE-ACETAMINOPHEN 5-325 MG PO TABS: 1.0000 | ORAL_TABLET | Freq: Four times a day (QID) | ORAL | Status: DC | PRN

## 2012-05-18 NOTE — Progress Notes (Signed)
Occupational Therapy Order received, reviewed chart/spoke with pt.  No acute OT needs identified. Pt has all DME and AE, and is familiar with use from previous THA.  Pt reports he has been using AE consistently at home PTA.   Will sign off.   Jeani Hawking, OTR/L [phone]

## 2012-05-18 NOTE — Progress Notes (Signed)
05/18/2012 1800 Pt HH arranged with [facility]. Pt is aware and no DME needs at dc. Isidoro Donning RN CCM Case Mgmt phone [phone]

## 2012-05-18 NOTE — Progress Notes (Signed)
Utilization Review Completed.[name] T3/30/2014  

## 2012-05-18 NOTE — Progress Notes (Signed)
Subjective: 2 Days Post-Op Procedure(s) (LRB): TOTAL HIP ARTHROPLASTY (Left) Patient reports pain as 3 on 0-10 scale.   Progressing with PT. Objective: Vital signs in last 24 hours: Temp:  [97.9 F (36.6 C)-101.3 F (38.5 C)] 99.9 F (37.7 C) (03/30 1037) Pulse Rate:  [71-92] 92 (03/30 1037) Resp:  [18-20] 20 (03/30 1037) BP: (128-135)/(53-67) 128/67 mmHg (03/30 1037) SpO2:  [86 %-96 %] 94 % (03/30 0544)  Intake/Output from previous day: 03/29 0701 - 03/30 0700 In: 120 [P.O.:120] Out: 1000 [Urine:1000] Intake/Output this shift: Total I/O In: -  Out: 500 [Urine:500]   Recent Labs  05/17/12 0700 05/18/12 0632  HGB 11.6* 11.1*    Recent Labs  05/17/12 0700 05/18/12 0632  WBC 11.8* 16.1*  RBC 4.13* 3.92*  HCT 34.7* 33.3*  PLT 247 217    Recent Labs  05/17/12 0700 05/18/12 0632  NA 137 135  K 4.0 3.6  CL 100 98  CO2 29 26  BUN 20 17  CREATININE 0.91 0.82  GLUCOSE 155* 189*  CALCIUM 8.4 8.5   Left hip exam: Neurologically intact Neurovascular intact Sensation intact distally Intact pulses distally Dorsiflexion/Plantar flexion intact Incision: dressing C/D/I and no drainage Compartment soft  Assessment/Plan: 2 Days Post-Op Procedure(s) (LRB): TOTAL HIP ARTHROPLASTY (Left) Plan: Discharge home with home health Will give one dose of Lovenox 40mg  SQ prior to discharge.  [name] 05/18/2012, 11:33 AM

## 2012-05-18 NOTE — Progress Notes (Signed)
Physical Therapy Treatment Patient Details Name: [name] MRN: [id number] DOB: 1955/03/20 Today's Date: 05/18/2012 Time: 1478-2956 PT Time Calculation (min): 26 min  PT Assessment / Plan / Recommendation Comments on Treatment Session  Pt progressing with PT goals & mobility.   Very motivated.  Min (A) for supine>sit, min guard for transfers, & (S) for ambulation.      Follow Up Recommendations  Home health PT;Supervision - Intermittent     Does the patient have the potential to tolerate intense rehabilitation     Barriers to Discharge        Equipment Recommendations  Rolling walker with 5" wheels    Recommendations for Other Services    Frequency 7X/week   Plan Discharge plan remains appropriate;Frequency remains appropriate    Precautions / Restrictions Precautions Precautions: Posterior Hip Required Braces or Orthoses: Knee Immobilizer - Left Knee Immobilizer - Left: On when out of bed or walking Restrictions LLE Weight Bearing: Weight bearing as tolerated   Pertinent Vitals/Pain Pt's 02 sats dropped to 86% RA when re-checked immediately upon returning to room after ambulation.  Cues for pursed lip breathing throughout session.      Mobility  Bed Mobility Bed Mobility: Supine to Sit;Sitting - Scoot to Edge of Bed Supine to Sit: 4: Min assist;HOB flat Sitting - Scoot to [place] of Bed: 4: Min guard Details for Bed Mobility Assistance: Cues for technique. (A) to lfit shoulders/trunk to sitting upright & for L LE management.   Transfers Transfers: Sit to Stand;Stand to Sit Sit to Stand: 4: Min guard;With upper extremity assist;From bed Stand to Sit: 4: Min guard;With upper extremity assist;With armrests;To chair/3-in-1 Details for Transfer Assistance: Cues for technique.  Ambulation/Gait Ambulation/Gait Assistance: 5: Supervision Ambulation Distance (Feet): 120 Feet Assistive device: Rolling walker Ambulation/Gait Assistance Details: Pt's 02 sats 90% RA at halfway  point but 86% immediately after returning back to room.  As distance increased pt able to smooth out gait pattern & not take such a long pause in between each step.  Beginning to progress to step-through.   Gait Pattern: Step-to pattern;Step-through pattern;Decreased step length - left;Decreased step length - right;Antalgic;Decreased weight shift to left;Decreased stance time - left;Decreased hip/knee flexion - left Stairs: No Wheelchair Mobility Wheelchair Mobility: No    Exercises Total Joint Exercises Ankle Circles/Pumps: AROM;Both;10 reps;Seated Quad Sets: AROM;Left;10 reps;Seated Gluteal Sets: AROM;Both;10 reps;Supine Heel Slides: AAROM;Left;10 reps;Supine Hip ABduction/ADduction: AAROM;Left;10 reps;Supine     PT Goals Acute Rehab PT Goals Time For Goal Achievement: 05/23/12 Potential to Achieve Goals: Good Pt will go Supine/Side to Sit: Independently;with HOB 0 degrees PT Goal: Supine/Side to Sit - Progress: Progressing toward goal Pt will go Sit to Supine/Side: Independently;with HOB 0 degrees Pt will go Sit to Stand: with supervision;with upper extremity assist PT Goal: Sit to Stand - Progress: Progressing toward goal Pt will Ambulate: 51 - 150 feet;with supervision;with rolling walker;with standard walker PT Goal: Ambulate - Progress: Progressing toward goal Pt will Go Up / Down Stairs: 1-2 stairs;with min assist;with least restrictive assistive device Pt will Perform Home Exercise Program: with supervision, verbal cues required/provided PT Goal: Perform Home Exercise Program - Progress: Progressing toward goal  Visit Information  Last PT Received On: 05/18/12 Assistance Needed: +1    Subjective Data      Cognition  Cognition Overall Cognitive Status: Appears within functional limits for tasks assessed/performed Arousal/Alertness: Awake/alert Orientation Level: Appears intact for tasks assessed Behavior During Session: [facility] for tasks performed    Balance  End  of Session PT - End of Session Equipment Utilized During Treatment: Gait belt Activity Tolerance: Patient tolerated treatment well Patient left: in chair;with call bell/phone within reach;with family/visitor present Nurse Communication: Mobility status     Verdell Face, [place] [phone] 05/18/2012

## 2012-05-18 NOTE — Discharge Summary (Signed)
Patient ID: [name] MRN: [id number] DOB/AGE: 1955-10-14 57 y.o.  Admit date: 05/16/2012 Discharge date: 05/18/2012  Admission Diagnoses:  Principal Problem:   Osteoarthritis of left hip Active Problems:   Severe obesity (BMI >= 40)   Discharge Diagnoses:  Same  Past Medical History  Diagnosis Date  . Shortness of breath   . Sleep apnea     uses bipap  . Hypertension   . CHF (congestive heart failure)   . Arthritis   . Hyperlipemia   . Diabetes mellitus     Surgeries: Procedure(s):Left TOTAL HIP ARTHROPLASTY on 05/16/2012   Discharged Condition: Improved  Hospital Course: Rondy Krupinski is an 57 y.o. male who was admitted 05/16/2012 for operative treatment ofOsteoarthritis of left hip. Patient has severe unremitting pain that affects sleep, daily activities, and work/hobbies. After pre-op clearance the patient was taken to the operating room on 05/16/2012 and underwent  Procedure(s):Left TOTAL HIP ARTHROPLASTY.    Patient was given perioperative antibiotics: Anti-infectives   Start     Dose/Rate Route Frequency Ordered Stop   05/16/12 1800  ceFAZolin (ANCEF) IVPB 2 g/50 mL premix     2 g 100 mL/hr over 30 Minutes Intravenous Every 6 hours 05/16/12 1542 05/17/12 0034   05/16/12 0600  ceFAZolin (ANCEF) 3 g in dextrose 5 % 50 mL IVPB     3 g 160 mL/hr over 30 Minutes Intravenous On call to O.R. 05/15/12 1520 05/16/12 1058       Patient was given sequential compression devices, early ambulation, and chemoprophylaxis to prevent DVT.  Patient benefited maximally from hospital stay and there were no complications.    Recent vital signs: Patient Vitals for the past 24 hrs:  BP Temp Temp src Pulse Resp SpO2 Height Weight  05/18/12 1100 - - - - - - 5' 8.11" (1.73 m) 139.3 kg (307 lb 1.6 oz)  05/18/12 1037 128/67 mmHg 99.9 F (37.7 C) Oral 92 20 - - -  05/18/12 0700 - 97.9 F (36.6 C) Oral - - - - -  05/18/12 0544 131/54 mmHg 101.3 F (38.5 C) Oral 71 18 94 % - -   05/17/12 2003 135/53 mmHg 99.1 F (37.3 C) Oral 71 18 96 % - -  05/17/12 1543 - - - - - 86 % - -  05/17/12 1437 135/55 mmHg 98.4 F (36.9 C) - 72 18 96 % - -     Recent laboratory studies:  Recent Labs  05/17/12 0700 05/18/12 0632  WBC 11.8* 16.1*  HGB 11.6* 11.1*  HCT 34.7* 33.3*  PLT 247 217  NA 137 135  K 4.0 3.6  CL 100 98  CO2 29 26  BUN 20 17  CREATININE 0.91 0.82  GLUCOSE 155* 189*  CALCIUM 8.4 8.5     Discharge Medications:     Medication List    TAKE these medications       amLODipine-valsartan 10-320 MG per tablet  Commonly known as:  EXFORGE  Take 1 tablet by mouth daily.     aspirin 325 MG EC tablet  Take 1 tablet (325 mg total) by mouth 2 (two) times daily after a meal.     atorvastatin 40 MG tablet  Commonly known as:  LIPITOR  Take 40 mg by mouth daily.     celecoxib 200 MG capsule  Commonly known as:  CELEBREX  Take 200 mg by mouth 2 (two) times daily.     furosemide 20 MG tablet  Commonly known as:  LASIX  Take 20 mg by mouth daily.     glimepiride 4 MG tablet  Commonly known as:  AMARYL  Take 4 mg by mouth 2 (two) times daily.     methocarbamol 500 MG tablet  Commonly known as:  ROBAXIN  Take 1.5 tablets (750 mg total) by mouth 3 (three) times daily. PRN Spasm.     multivitamin with minerals Tabs  Take 1 tablet by mouth daily.     oxyCODONE-acetaminophen 5-325 MG per tablet  Commonly known as:  PERCOCET/ROXICET  Take 1-2 tablets by mouth every 6 (six) hours as needed for pain.     sitaGLIPtan-metformin 50-1000 MG per tablet  Commonly known as:  JANUMET  Take 1 tablet by mouth 2 (two) times daily with a meal.        Diagnostic Studies: Dg Chest 2 View  05/09/2012  *RADIOLOGY REPORT*  Clinical Data: Preop for hip replacement  CHEST - 2 VIEW  Comparison: Portable chest x-ray of 11/13/2005  Findings: There is no change in the chronic elevation of the right hemidiaphragm.  No active infiltrate or effusion is seen.  The heart is  mildly enlarged and stable.  No acute bony abnormality is seen.  IMPRESSION: Stable chest x-ray.  No active lung disease.   Original Report Authenticated By: Dwyane Dee, M.D.    Dg Pelvis Portable  05/16/2012  *RADIOLOGY REPORT*  Clinical Data: Left total hip arthroplasty.  PORTABLE PELVIS  Comparison: None.  Findings: There are bilateral hip arthroplasties.  No obvious subcutaneous air.  IMPRESSION: Bilateral hip arthroplasties.   Original Report Authenticated By: Leanna Battles, M.D.    Dg Hip Portable 1 View Left  05/16/2012  *RADIOLOGY REPORT*  Clinical Data: Postop arthroplasty.  PORTABLE LEFT HIP - 1 VIEW  Comparison: None.  Findings: Single cross-table lateral view of the left hip is submitted.  Image quality is degraded by body habitus.  Left hip arthroplasty is seen with a well-seated femoral stem.  IMPRESSION: Well seated left femoral stem after hip arthroplasty.   Original Report Authenticated By: Leanna Battles, M.D.     Disposition: 01-Home or Self Care      Discharge Orders   Future Orders Complete By Expires     Call MD / Call 911  As directed     Comments:      If you experience chest pain or shortness of breath, CALL 911 and be transported to the hospital emergency room.  If you develope a fever above 101 F, pus (white drainage) or increased drainage or redness at the wound, or calf pain, call your surgeon's office.    Constipation Prevention  As directed     Comments:      Drink plenty of fluids.  Prune juice may be helpful.  You may use a stool softener, such as Colace (over the counter) 100 mg twice a day.  Use MiraLax (over the counter) for constipation as needed.    Diet Carb Modified  As directed     Follow the hip precautions as taught in Physical Therapy  As directed     Increase activity slowly as tolerated  As directed     TED hose  As directed     Comments:      Use stockings (TED hose) for 2  weeks on both leg(s).  You may remove them at night for sleeping.     Weight bearing as tolerated  As directed        Follow-up Information   Follow  up with GRAVES,JOHN L, MD. Schedule an appointment as soon as possible for a visit in 2 weeks.   Contact information:   [address] [place] [address] [phone]        Signed: Matthew Folks 05/18/2012, 11:43 AM

## 2012-05-19 ENCOUNTER — Encounter (HOSPITAL_COMMUNITY): Payer: Self-pay | Admitting: Orthopedic Surgery

## 2012-09-10 ENCOUNTER — Encounter (HOSPITAL_COMMUNITY): Payer: Self-pay | Admitting: Pharmacy Technician

## 2012-09-15 ENCOUNTER — Other Ambulatory Visit: Payer: Self-pay | Admitting: Orthopedic Surgery

## 2012-09-17 ENCOUNTER — Encounter (HOSPITAL_COMMUNITY)
Admission: RE | Admit: 2012-09-17 | Discharge: 2012-09-17 | Disposition: A | Discharge status: Home / Self Care | Payer: BC Managed Care – PPO | Source: Ambulatory Visit | Attending: Orthopedic Surgery | Admitting: Orthopedic Surgery

## 2012-09-17 ENCOUNTER — Encounter (HOSPITAL_COMMUNITY): Payer: Self-pay

## 2012-09-17 DIAGNOSIS — Z01812 Encounter for preprocedural laboratory examination: Secondary | ICD-10-CM | POA: Insufficient documentation

## 2012-09-17 HISTORY — DX: Pain in unspecified joint: M25.50

## 2012-09-17 HISTORY — DX: Localized edema: R60.0

## 2012-09-17 HISTORY — DX: Effusion, unspecified joint: M25.40

## 2012-09-17 HISTORY — DX: Edema, unspecified: R60.9

## 2012-09-17 LAB — PROTIME-INR
INR: 0.88 (ref 0.00–1.49)
Prothrombin Time: 11.8 seconds (ref 11.6–15.2)

## 2012-09-17 LAB — SURGICAL PCR SCREEN
MRSA, PCR: NEGATIVE
Staphylococcus aureus: NEGATIVE

## 2012-09-17 LAB — CBC
HCT: 43.8 % (ref 39.0–52.0)
MCHC: 34.9 g/dL (ref 30.0–36.0)
Platelets: 295 10*3/uL (ref 150–400)
RDW: 14.4 % (ref 11.5–15.5)
WBC: 11.4 10*3/uL — ABNORMAL HIGH (ref 4.0–10.5)

## 2012-09-17 LAB — BASIC METABOLIC PANEL
BUN: 15 mg/dL (ref 6–23)
Chloride: 98 mEq/L (ref 96–112)
Creatinine, Ser: 0.71 mg/dL (ref 0.50–1.35)
GFR calc Af Amer: >90 mL/min (ref >90)
GFR calc non Af Amer: >90 mL/min (ref >90)

## 2012-09-17 LAB — APTT: aPTT: 27 seconds (ref 24–37)

## 2012-09-17 NOTE — Progress Notes (Signed)
Left a message with Darl Pikes that orders are needed

## 2012-09-17 NOTE — Progress Notes (Signed)
Cardiologist and Medical Md is Dr.Spruill-last visit 3months ago  Echo and Stress test reprot in 2007  EKG and CXR in epic from 05-09-12  Sleep study in epic from 2007

## 2012-09-17 NOTE — Pre-Procedure Instructions (Signed)
REYNOLDS KITTEL  09/17/2012   Your procedure is scheduled on: Fri, Aug 8 @ 10:05 AM  Report to [facility] at 8:00 AM.  Call this number if you have problems the morning of surgery: [phone]   Remember:   Do not eat food or drink liquids after midnight.   Take these medicines the morning of surgery with A SIP OF WATER: Amlodipine-Valsartan(Exforge)              No Aspirin,Goody's,BC's,Aleve,Fish Oil,or any Herbal Medications.Stop taking your Celebrex 7 days prior to surgery   Do not wear jewelry  Do not wear lotions, powders, or colognes. You may wear deodorant.  Men may shave face and neck.  Do not bring valuables to the hospital.  [facility] is not responsible                   for any belongings or valuables.  Contacts, dentures or bridgework may not be worn into surgery.  Leave suitcase in the car. After surgery it may be brought to your room.  For patients admitted to the hospital, checkout time is 11:00 AM the day of  discharge.     Special Instructions: Shower using CHG 2 nights before surgery and the night before surgery.  If you shower the day of surgery use CHG.  Use special wash - you have one bottle of CHG for all showers.  You should use approximately 1/3 of the bottle for each shower.   Please read over the following fact sheets that you were given: Pain Booklet, Coughing and Deep Breathing, Blood Transfusion Information, Total Joint Packet, MRSA Information and Surgical Site Infection Prevention

## 2012-09-25 MED ORDER — DEXTROSE 5 % IV SOLN
3.0000 g | INTRAVENOUS | Status: AC
  Administered 2012-09-26: 3 g via INTRAVENOUS
  Filled 2012-09-25: qty 3000

## 2012-09-26 ENCOUNTER — Encounter (HOSPITAL_COMMUNITY): Payer: Self-pay | Admitting: *Deleted

## 2012-09-26 ENCOUNTER — Inpatient Hospital Stay (HOSPITAL_COMMUNITY): Payer: BC Managed Care – PPO | Admitting: Anesthesiology

## 2012-09-26 ENCOUNTER — Encounter (HOSPITAL_COMMUNITY)
Admission: RE | Disposition: A | Discharge status: Home Health | Payer: Self-pay | Source: Ambulatory Visit | Attending: Orthopedic Surgery

## 2012-09-26 ENCOUNTER — Inpatient Hospital Stay (HOSPITAL_COMMUNITY)
Admission: RE | Admit: 2012-09-26 | Discharge: 2012-09-28 | DRG: 209 | Disposition: A | Discharge status: Home Health | Payer: BC Managed Care – PPO | Source: Ambulatory Visit | Attending: Orthopedic Surgery | Admitting: Orthopedic Surgery

## 2012-09-26 ENCOUNTER — Encounter (HOSPITAL_COMMUNITY): Payer: Self-pay | Admitting: Anesthesiology

## 2012-09-26 DIAGNOSIS — Z6841 Body Mass Index (BMI) 40.0 and over, adult: Secondary | ICD-10-CM

## 2012-09-26 DIAGNOSIS — E119 Type 2 diabetes mellitus without complications: Secondary | ICD-10-CM | POA: Diagnosis present

## 2012-09-26 DIAGNOSIS — I10 Essential (primary) hypertension: Secondary | ICD-10-CM | POA: Diagnosis present

## 2012-09-26 DIAGNOSIS — Z96649 Presence of unspecified artificial hip joint: Secondary | ICD-10-CM

## 2012-09-26 DIAGNOSIS — G4730 Sleep apnea, unspecified: Secondary | ICD-10-CM | POA: Diagnosis present

## 2012-09-26 DIAGNOSIS — M1710 Unilateral primary osteoarthritis, unspecified knee: Principal | ICD-10-CM | POA: Diagnosis present

## 2012-09-26 DIAGNOSIS — R609 Edema, unspecified: Secondary | ICD-10-CM | POA: Diagnosis present

## 2012-09-26 DIAGNOSIS — E785 Hyperlipidemia, unspecified: Secondary | ICD-10-CM | POA: Diagnosis present

## 2012-09-26 DIAGNOSIS — Z79899 Other long term (current) drug therapy: Secondary | ICD-10-CM

## 2012-09-26 DIAGNOSIS — M1712 Unilateral primary osteoarthritis, left knee: Secondary | ICD-10-CM | POA: Diagnosis present

## 2012-09-26 HISTORY — PX: TOTAL KNEE ARTHROPLASTY: SHX125

## 2012-09-26 LAB — HEPATIC FUNCTION PANEL
ALT: 43 U/L (ref 0–53)
AST: 29 U/L (ref 0–37)
Albumin: 3.9 g/dL (ref 3.5–5.2)
Alkaline Phosphatase: 74 U/L (ref 39–117)
Total Bilirubin: 0.5 mg/dL (ref 0.3–1.2)
Total Protein: 7.1 g/dL (ref 6.0–8.3)

## 2012-09-26 LAB — URINALYSIS, ROUTINE W REFLEX MICROSCOPIC
Bilirubin Urine: NEGATIVE
Glucose, UA: 250 mg/dL — AB
Hgb urine dipstick: NEGATIVE
Specific Gravity, Urine: 1.022 (ref 1.005–1.030)
pH: 5.5 (ref 5.0–8.0)

## 2012-09-26 LAB — GLUCOSE, CAPILLARY: Glucose-Capillary: 283 mg/dL — ABNORMAL HIGH (ref 70–99)

## 2012-09-26 LAB — URINE MICROSCOPIC-ADD ON

## 2012-09-26 SURGERY — ARTHROPLASTY, KNEE, TOTAL
Anesthesia: General | Site: Knee | Laterality: Left | Wound class: Clean

## 2012-09-26 MED ORDER — POLYETHYLENE GLYCOL 3350 17 G PO PACK: 17.0000 g | PACK | Freq: Every day | ORAL | Status: DC | PRN

## 2012-09-26 MED ORDER — CEFUROXIME SODIUM 1.5 G IJ SOLR
INTRAMUSCULAR | Status: DC | PRN
  Administered 2012-09-26: 1.5 g

## 2012-09-26 MED ORDER — LACTATED RINGERS IV SOLN
INTRAVENOUS | Status: DC
Start: 2012-09-26 — End: 2012-09-26
  Administered 2012-09-26: 09:00:00 via INTRAVENOUS

## 2012-09-26 MED ORDER — FUROSEMIDE 20 MG PO TABS
20.0000 mg | ORAL_TABLET | Freq: Every day | ORAL | Status: DC
  Administered 2012-09-26 – 2012-09-28 (×3): 20 mg via ORAL
  Filled 2012-09-26 (×3): qty 1

## 2012-09-26 MED ORDER — ZOLPIDEM TARTRATE 5 MG PO TABS: 5.0000 mg | ORAL_TABLET | Freq: Every evening | ORAL | Status: DC | PRN

## 2012-09-26 MED ORDER — DOCUSATE SODIUM 100 MG PO CAPS
100.0000 mg | ORAL_CAPSULE | Freq: Two times a day (BID) | ORAL | Status: DC
  Administered 2012-09-26 – 2012-09-28 (×4): 100 mg via ORAL
  Filled 2012-09-26 (×4): qty 1

## 2012-09-26 MED ORDER — NEOSTIGMINE METHYLSULFATE 1 MG/ML IJ SOLN
INTRAMUSCULAR | Status: DC | PRN
  Administered 2012-09-26: 4 mg via INTRAVENOUS

## 2012-09-26 MED ORDER — HYDROMORPHONE HCL PF 1 MG/ML IJ SOLN
INTRAMUSCULAR | Status: AC
  Filled 2012-09-26: qty 1

## 2012-09-26 MED ORDER — MIDAZOLAM HCL 2 MG/2ML IJ SOLN: 2.0000 mg | Freq: Once | INTRAMUSCULAR | Status: AC

## 2012-09-26 MED ORDER — DEXTROSE 5 % IV SOLN: 3.0000 g | Freq: Four times a day (QID) | INTRAVENOUS | Status: DC

## 2012-09-26 MED ORDER — PROPOFOL 10 MG/ML IV BOLUS
INTRAVENOUS | Status: DC | PRN
  Administered 2012-09-26: 300 mg via INTRAVENOUS

## 2012-09-26 MED ORDER — SUCCINYLCHOLINE CHLORIDE 20 MG/ML IJ SOLN
INTRAMUSCULAR | Status: DC | PRN
  Administered 2012-09-26: 120 mg via INTRAVENOUS

## 2012-09-26 MED ORDER — PROMETHAZINE HCL 25 MG/ML IJ SOLN: 12.5000 mg | Freq: Four times a day (QID) | INTRAMUSCULAR | Status: DC | PRN

## 2012-09-26 MED ORDER — ACETAMINOPHEN 650 MG RE SUPP: 650.0000 mg | Freq: Four times a day (QID) | RECTAL | Status: DC | PRN

## 2012-09-26 MED ORDER — ONDANSETRON HCL 4 MG/2ML IJ SOLN: 4.0000 mg | Freq: Four times a day (QID) | INTRAMUSCULAR | Status: DC | PRN

## 2012-09-26 MED ORDER — METHOCARBAMOL 500 MG PO TABS
ORAL_TABLET | ORAL | Status: AC
  Filled 2012-09-26: qty 1

## 2012-09-26 MED ORDER — FENTANYL CITRATE 0.05 MG/ML IJ SOLN
INTRAMUSCULAR | Status: DC | PRN
  Administered 2012-09-26 (×4): 50 ug via INTRAVENOUS
  Administered 2012-09-26 (×3): 25 ug via INTRAVENOUS
  Administered 2012-09-26: 100 ug via INTRAVENOUS
  Administered 2012-09-26: 50 ug via INTRAVENOUS

## 2012-09-26 MED ORDER — HYDROMORPHONE HCL PF 1 MG/ML IJ SOLN
1.0000 mg | INTRAMUSCULAR | Status: DC | PRN
  Administered 2012-09-27: 2 mg via INTRAVENOUS
  Administered 2012-09-27: 1 mg via INTRAVENOUS
  Administered 2012-09-28: 2 mg via INTRAVENOUS
  Filled 2012-09-26: qty 2
  Filled 2012-09-26: qty 1
  Filled 2012-09-26: qty 2

## 2012-09-26 MED ORDER — ROCURONIUM BROMIDE 100 MG/10ML IV SOLN
INTRAVENOUS | Status: DC | PRN
  Administered 2012-09-26: 30 mg via INTRAVENOUS

## 2012-09-26 MED ORDER — AMLODIPINE BESYLATE-VALSARTAN 10-320 MG PO TABS: 1.0000 | ORAL_TABLET | Freq: Every day | ORAL | Status: DC

## 2012-09-26 MED ORDER — SODIUM CHLORIDE 0.9 % IV SOLN
10.0000 mg | INTRAVENOUS | Status: DC | PRN
  Administered 2012-09-26: 10 ug/min via INTRAVENOUS

## 2012-09-26 MED ORDER — SODIUM CHLORIDE 0.9 % IR SOLN
Status: DC | PRN
  Administered 2012-09-26: 1000 mL

## 2012-09-26 MED ORDER — CEFUROXIME SODIUM 1.5 G IJ SOLR
INTRAMUSCULAR | Status: AC
  Filled 2012-09-26: qty 1.5

## 2012-09-26 MED ORDER — DIPHENHYDRAMINE HCL 12.5 MG/5ML PO ELIX: 12.5000 mg | ORAL_SOLUTION | ORAL | Status: DC | PRN

## 2012-09-26 MED ORDER — KETOROLAC TROMETHAMINE 30 MG/ML IJ SOLN
INTRAMUSCULAR | Status: DC | PRN
  Administered 2012-09-26: 30 mg via INTRAVENOUS

## 2012-09-26 MED ORDER — ATORVASTATIN CALCIUM 40 MG PO TABS
40.0000 mg | ORAL_TABLET | ORAL | Status: DC
  Administered 2012-09-26: 40 mg via ORAL
  Filled 2012-09-26: qty 1

## 2012-09-26 MED ORDER — SITAGLIPTIN PHOS-METFORMIN HCL 50-1000 MG PO TABS: 1.0000 | ORAL_TABLET | Freq: Two times a day (BID) | ORAL | Status: DC

## 2012-09-26 MED ORDER — POVIDONE-IODINE 7.5 % EX SOLN
Freq: Once | CUTANEOUS | Status: DC
  Filled 2012-09-26: qty 118

## 2012-09-26 MED ORDER — INSULIN ASPART 100 UNIT/ML ~~LOC~~ SOLN
0.0000 [IU] | Freq: Three times a day (TID) | SUBCUTANEOUS | Status: DC
  Administered 2012-09-26 – 2012-09-27 (×2): 8 [IU] via SUBCUTANEOUS
  Administered 2012-09-27: 3 [IU] via SUBCUTANEOUS
  Administered 2012-09-27: 11 [IU] via SUBCUTANEOUS
  Administered 2012-09-28: 2 [IU] via SUBCUTANEOUS
  Administered 2012-09-28: 5 [IU] via SUBCUTANEOUS

## 2012-09-26 MED ORDER — FENTANYL CITRATE 0.05 MG/ML IJ SOLN
INTRAMUSCULAR | Status: AC
  Administered 2012-09-26: 100 ug via INTRAVENOUS
  Filled 2012-09-26: qty 2

## 2012-09-26 MED ORDER — CHLORHEXIDINE GLUCONATE 4 % EX LIQD: 60.0000 mL | Freq: Once | CUTANEOUS | Status: DC

## 2012-09-26 MED ORDER — FENTANYL CITRATE 0.05 MG/ML IJ SOLN: 100.0000 ug | Freq: Once | INTRAMUSCULAR | Status: AC

## 2012-09-26 MED ORDER — HYDROMORPHONE HCL PF 1 MG/ML IJ SOLN
0.2500 mg | INTRAMUSCULAR | Status: DC | PRN
  Administered 2012-09-26 (×6): 0.5 mg via INTRAVENOUS

## 2012-09-26 MED ORDER — VALSARTAN 160 MG PO TABS
320.0000 mg | ORAL_TABLET | Freq: Every day | ORAL | Status: DC
  Administered 2012-09-27 – 2012-09-28 (×2): 320 mg via ORAL
  Filled 2012-09-26 (×2): qty 2

## 2012-09-26 MED ORDER — SODIUM CHLORIDE 0.9 % IV SOLN
INTRAVENOUS | Status: DC
  Administered 2012-09-26 – 2012-09-28 (×3): via INTRAVENOUS

## 2012-09-26 MED ORDER — ASPIRIN EC 325 MG PO TBEC
325.0000 mg | DELAYED_RELEASE_TABLET | Freq: Two times a day (BID) | ORAL | Status: DC
  Administered 2012-09-26 – 2012-09-28 (×4): 325 mg via ORAL
  Filled 2012-09-26 (×6): qty 1

## 2012-09-26 MED ORDER — GLYCOPYRROLATE 0.2 MG/ML IJ SOLN
INTRAMUSCULAR | Status: DC | PRN
  Administered 2012-09-26: .7 mg via INTRAVENOUS

## 2012-09-26 MED ORDER — ACETAMINOPHEN 325 MG PO TABS: 650.0000 mg | ORAL_TABLET | Freq: Four times a day (QID) | ORAL | Status: DC | PRN

## 2012-09-26 MED ORDER — LIDOCAINE HCL (CARDIAC) 20 MG/ML IV SOLN
INTRAVENOUS | Status: DC | PRN
  Administered 2012-09-26: 80 mg via INTRAVENOUS

## 2012-09-26 MED ORDER — CELECOXIB 200 MG PO CAPS
200.0000 mg | ORAL_CAPSULE | Freq: Two times a day (BID) | ORAL | Status: DC
  Administered 2012-09-26 – 2012-09-28 (×4): 200 mg via ORAL
  Filled 2012-09-26 (×5): qty 1

## 2012-09-26 MED ORDER — METFORMIN HCL 500 MG PO TABS
1000.0000 mg | ORAL_TABLET | Freq: Two times a day (BID) | ORAL | Status: DC
  Administered 2012-09-27 – 2012-09-28 (×3): 1000 mg via ORAL
  Filled 2012-09-26 (×5): qty 2

## 2012-09-26 MED ORDER — ONDANSETRON HCL 4 MG PO TABS: 4.0000 mg | ORAL_TABLET | Freq: Four times a day (QID) | ORAL | Status: DC | PRN

## 2012-09-26 MED ORDER — MIDAZOLAM HCL 2 MG/2ML IJ SOLN
INTRAMUSCULAR | Status: AC
  Administered 2012-09-26: 2 mg via INTRAVENOUS
  Filled 2012-09-26: qty 2

## 2012-09-26 MED ORDER — OXYCODONE-ACETAMINOPHEN 5-325 MG PO TABS
1.0000 | ORAL_TABLET | ORAL | Status: DC | PRN
  Administered 2012-09-26 – 2012-09-28 (×9): 2 via ORAL
  Filled 2012-09-26 (×9): qty 2

## 2012-09-26 MED ORDER — ONDANSETRON HCL 4 MG/2ML IJ SOLN
INTRAMUSCULAR | Status: DC | PRN
  Administered 2012-09-26: 4 mg via INTRAVENOUS

## 2012-09-26 MED ORDER — FERROUS SULFATE 325 (65 FE) MG PO TABS
325.0000 mg | ORAL_TABLET | Freq: Two times a day (BID) | ORAL | Status: DC
  Administered 2012-09-26 – 2012-09-28 (×4): 325 mg via ORAL
  Filled 2012-09-26 (×6): qty 1

## 2012-09-26 MED ORDER — LINAGLIPTIN 5 MG PO TABS
5.0000 mg | ORAL_TABLET | Freq: Two times a day (BID) | ORAL | Status: DC
  Administered 2012-09-27 – 2012-09-28 (×3): 5 mg via ORAL
  Filled 2012-09-26 (×5): qty 1

## 2012-09-26 MED ORDER — ALUM & MAG HYDROXIDE-SIMETH 200-200-20 MG/5ML PO SUSP: 30.0000 mL | ORAL | Status: DC | PRN

## 2012-09-26 MED ORDER — CEFAZOLIN SODIUM-DEXTROSE 2-3 GM-% IV SOLR
2.0000 g | Freq: Four times a day (QID) | INTRAVENOUS | Status: AC
  Administered 2012-09-26 (×2): 2 g via INTRAVENOUS
  Filled 2012-09-26 (×2): qty 50

## 2012-09-26 MED ORDER — TRANEXAMIC ACID 100 MG/ML IV SOLN
1000.0000 mg | INTRAVENOUS | Status: AC
  Administered 2012-09-26: 1000 mg via INTRAVENOUS
  Filled 2012-09-26: qty 10

## 2012-09-26 MED ORDER — AMLODIPINE BESYLATE 10 MG PO TABS
10.0000 mg | ORAL_TABLET | Freq: Every day | ORAL | Status: DC
  Administered 2012-09-27 – 2012-09-28 (×2): 10 mg via ORAL
  Filled 2012-09-26 (×2): qty 1

## 2012-09-26 MED ORDER — GLIMEPIRIDE 4 MG PO TABS
4.0000 mg | ORAL_TABLET | Freq: Two times a day (BID) | ORAL | Status: DC
  Administered 2012-09-27 – 2012-09-28 (×3): 4 mg via ORAL
  Filled 2012-09-26 (×5): qty 1

## 2012-09-26 MED ORDER — METHOCARBAMOL 100 MG/ML IJ SOLN
500.0000 mg | Freq: Four times a day (QID) | INTRAVENOUS | Status: DC | PRN
  Filled 2012-09-26: qty 5

## 2012-09-26 MED ORDER — METHOCARBAMOL 500 MG PO TABS
500.0000 mg | ORAL_TABLET | Freq: Four times a day (QID) | ORAL | Status: DC | PRN
  Administered 2012-09-26 – 2012-09-28 (×6): 500 mg via ORAL
  Filled 2012-09-26 (×5): qty 1

## 2012-09-26 MED ORDER — LACTATED RINGERS IV SOLN
INTRAVENOUS | Status: DC | PRN
  Administered 2012-09-26 (×3): via INTRAVENOUS

## 2012-09-26 MED ORDER — MIDAZOLAM HCL 5 MG/5ML IJ SOLN
INTRAMUSCULAR | Status: DC | PRN
  Administered 2012-09-26: 2 mg via INTRAVENOUS

## 2012-09-26 SURGICAL SUPPLY — 66 items
APL SKNCLS STERI-STRIP NONHPOA (GAUZE/BANDAGES/DRESSINGS) ×1
BANDAGE ESMARK 6X9 LF (GAUZE/BANDAGES/DRESSINGS) ×1 IMPLANT
BENZOIN TINCTURE PRP APPL 2/3 (GAUZE/BANDAGES/DRESSINGS) ×2 IMPLANT
BLADE SAGITTAL 25.0X1.19X90 (BLADE) ×2 IMPLANT
BLADE SAW SAG 90X13X1.27 (BLADE) ×2 IMPLANT
BNDG CMPR 9X6 STRL LF SNTH (GAUZE/BANDAGES/DRESSINGS) ×1
BNDG ESMARK 6X9 LF (GAUZE/BANDAGES/DRESSINGS) ×2
BOWL SMART MIX CTS (DISPOSABLE) ×2 IMPLANT
CAPT RP KNEE ×1 IMPLANT
CEMENT HV SMART SET (Cement) ×4 IMPLANT
CLOTH BEACON ORANGE TIMEOUT ST (SAFETY) ×2 IMPLANT
COVER SURGICAL LIGHT HANDLE (MISCELLANEOUS) ×2 IMPLANT
CUFF TOURNIQUET SINGLE 34IN LL (TOURNIQUET CUFF) ×2 IMPLANT
CUFF TOURNIQUET SINGLE 44IN (TOURNIQUET CUFF) IMPLANT
DRAPE EXTREMITY T 121X128X90 (DRAPE) ×2 IMPLANT
DRAPE U-SHAPE 47X51 STRL (DRAPES) ×2 IMPLANT
DRSG ADAPTIC 3X8 NADH LF (GAUZE/BANDAGES/DRESSINGS) ×1 IMPLANT
DRSG PAD ABDOMINAL 8X10 ST (GAUZE/BANDAGES/DRESSINGS) ×2 IMPLANT
DURAPREP 26ML APPLICATOR (WOUND CARE) ×2 IMPLANT
ELECT REM PT RETURN 9FT ADLT (ELECTROSURGICAL) ×2
ELECTRODE REM PT RTRN 9FT ADLT (ELECTROSURGICAL) ×1 IMPLANT
EVACUATOR 1/8 PVC DRAIN (DRAIN) ×2 IMPLANT
FACESHIELD LNG OPTICON STERILE (SAFETY) ×2 IMPLANT
GAUZE XEROFORM 1X8 LF (GAUZE/BANDAGES/DRESSINGS) ×1 IMPLANT
GAUZE XEROFORM 5X9 LF (GAUZE/BANDAGES/DRESSINGS) ×2 IMPLANT
GLOVE BIOGEL PI IND STRL 8 (GLOVE) ×2 IMPLANT
GLOVE BIOGEL PI INDICATOR 8 (GLOVE) ×2
GLOVE ECLIPSE 7.5 STRL STRAW (GLOVE) ×4 IMPLANT
GOWN PREVENTION PLUS LG XLONG (DISPOSABLE) IMPLANT
GOWN STRL NON-REIN LRG LVL3 (GOWN DISPOSABLE) ×2 IMPLANT
GOWN STRL REIN XL XLG (GOWN DISPOSABLE) ×4 IMPLANT
HANDPIECE INTERPULSE COAX TIP (DISPOSABLE) ×2
HOOD PEEL AWAY FACE SHEILD DIS (HOOD) ×6 IMPLANT
IMMOBILIZER KNEE 20 (SOFTGOODS)
IMMOBILIZER KNEE 20 THIGH 36 (SOFTGOODS) IMPLANT
IMMOBILIZER KNEE 22 UNIV (SOFTGOODS) ×2 IMPLANT
IMMOBILIZER KNEE 24 THIGH 36 (MISCELLANEOUS) IMPLANT
IMMOBILIZER KNEE 24 UNIV (MISCELLANEOUS)
KIT BASIN OR (CUSTOM PROCEDURE TRAY) ×2 IMPLANT
KIT ROOM TURNOVER OR (KITS) ×2 IMPLANT
MANIFOLD NEPTUNE II (INSTRUMENTS) ×2 IMPLANT
NDL HYPO 25GX1X1/2 BEV (NEEDLE) IMPLANT
NEEDLE HYPO 25GX1X1/2 BEV (NEEDLE) IMPLANT
NS IRRIG 1000ML POUR BTL (IV SOLUTION) ×2 IMPLANT
PACK TOTAL JOINT (CUSTOM PROCEDURE TRAY) ×2 IMPLANT
PAD ARMBOARD 7.5X6 YLW CONV (MISCELLANEOUS) ×4 IMPLANT
PAD CAST 4YDX4 CTTN HI CHSV (CAST SUPPLIES) ×1 IMPLANT
PADDING CAST ABS 4INX4YD NS (CAST SUPPLIES) ×1
PADDING CAST ABS COTTON 4X4 ST (CAST SUPPLIES) IMPLANT
PADDING CAST COTTON 4X4 STRL (CAST SUPPLIES) ×2
SET HNDPC FAN SPRY TIP SCT (DISPOSABLE) ×1 IMPLANT
SPONGE GAUZE 4X4 12PLY (GAUZE/BANDAGES/DRESSINGS) ×2 IMPLANT
STAPLER VISISTAT 35W (STAPLE) IMPLANT
STRIP CLOSURE SKIN 1/2X4 (GAUZE/BANDAGES/DRESSINGS) ×2 IMPLANT
SUCTION FRAZIER TIP 10 FR DISP (SUCTIONS) ×2 IMPLANT
SUT MON AB 3-0 SH 27 (SUTURE)
SUT MON AB 3-0 SH27 (SUTURE) IMPLANT
SUT VIC AB 0 CTB1 27 (SUTURE) ×4 IMPLANT
SUT VIC AB 1 CT1 27 (SUTURE) ×4
SUT VIC AB 1 CT1 27XBRD ANBCTR (SUTURE) ×2 IMPLANT
SUT VIC AB 2-0 CTB1 (SUTURE) ×4 IMPLANT
SYR CONTROL 10ML LL (SYRINGE) IMPLANT
TOWEL OR 17X24 6PK STRL BLUE (TOWEL DISPOSABLE) ×2 IMPLANT
TOWEL OR 17X26 10 PK STRL BLUE (TOWEL DISPOSABLE) ×2 IMPLANT
TRAY FOLEY CATH 16FRSI W/METER (SET/KITS/TRAYS/PACK) ×2 IMPLANT
WATER STERILE IRR 1000ML POUR (IV SOLUTION) ×4 IMPLANT

## 2012-09-26 NOTE — Transfer of Care (Signed)
Immediate Anesthesia Transfer of Care Note  Patient: [name]  Procedure(s) Performed: Procedure(s) with comments: TOTAL KNEE ARTHROPLASTY (Left) - GENERAL ANESTHESIA WITH PRE OP FEMORAL NRVE BLOCK   Patient Location: PACU  Anesthesia Type:General  Level of Consciousness: sedated  Airway & Oxygen Therapy: Mask O2 intact  Post-op Assessment: Report given to PACU RN and Post -op Vital signs reviewed and stable  Post vital signs: Reviewed and stable  Complications: No apparent anesthesia complications

## 2012-09-26 NOTE — Progress Notes (Signed)
[facility]  Patient Status: New  [facility] is providing the following services: PT  - referral from MD office.  On the schedule for a PT visit at home on Monday.  If patient discharges after hours, please call [phone].   Jodene Nam 09/26/2012, 4:58 PM

## 2012-09-26 NOTE — H&P (Signed)
TOTAL KNEE ADMISSION H&P  Patient is being admitted for left total knee arthroplasty.  Subjective:  Chief Complaint:left knee pain.  HPI: [name], 57 y.o. male, has a history of pain and functional disability in the left knee due to arthritis and has failed non-surgical conservative treatments for greater than 12 weeks to includeNSAID's and/or analgesics, corticosteriod injections, supervised PT with diminished ADL's post treatment, weight reduction as appropriate and activity modification.  Onset of symptoms was gradual, starting 5 years ago with gradually worsening course since that time. The patient noted no past surgery on the left knee(s).  Patient currently rates pain in the left knee(s) at 8 out of 10 with activity. Patient has night pain, worsening of pain with activity and weight bearing, pain that interferes with activities of daily living, pain with passive range of motion, crepitus and joint swelling.  Patient has evidence of subchondral sclerosis, periarticular osteophytes, joint subluxation and joint space narrowing by imaging studies. This patient has had failure of conservative care. There is no active infection.  Patient Active Problem List   Diagnosis Date Noted  . Osteoarthritis of left hip 05/16/2012  . Severe obesity (BMI >= 40) 05/16/2012   Past Medical History  Diagnosis Date  . Arthritis   . Hyperlipemia     takes Lipitor daily  . Hypertension     takes Exforge daily  . Peripheral edema     takes Lasix daily  . Sleep apnea     uses bipap  . Joint pain   . Joint swelling   . Diabetes mellitus     takes Janumet and AMaryl daily    Past Surgical History  Procedure Laterality Date  . Joint replacement  2006    rt hip  . Achilles tendon surgery      lt  . Knee arthroscopy      lt  . Tonsillectomy    . Colonoscopy    . Rotator cuff repair Left 03-26-2011  . Total hip arthroplasty Left 05/16/2012    Procedure: TOTAL HIP ARTHROPLASTY;  Surgeon: Harvie Junior, MD;  Location: [facility] OR;  Service: Orthopedics;  Laterality: Left;    Prescriptions prior to admission  Medication Sig Dispense Refill  . amLODipine-valsartan (EXFORGE) 10-320 MG per tablet Take 1 tablet by mouth daily.      Marland Kitchen atorvastatin (LIPITOR) 40 MG tablet Take 40 mg by mouth 3 (three) times a week. *doesn't take every day because of muscle fatigue      . celecoxib (CELEBREX) 200 MG capsule Take 200 mg by mouth daily.       . furosemide (LASIX) 20 MG tablet Take 20 mg by mouth daily.       Marland Kitchen glimepiride (AMARYL) 4 MG tablet Take 4 mg by mouth 2 (two) times daily.      . Multiple Vitamin (MULTIVITAMIN WITH MINERALS) TABS Take 1 tablet by mouth daily.      . sitaGLIPtan-metformin (JANUMET) 50-1000 MG per tablet Take 1 tablet by mouth 2 (two) times daily with a meal.       Allergies  Allergen Reactions  . Strawberry Rash    History  Substance Use Topics  . Smoking status: Never Smoker   . Smokeless tobacco: Not on file  . Alcohol Use: No    History reviewed. No pertinent family history.   ROS  Objective:  Physical Exam  Vital signs in last 24 hours: Temp:  [97.8 F (36.6 C)] 97.8 F (36.6 C) (08/08 0818)  Pulse Rate:  [75] 75 (08/08 0818) Resp:  [20] 20 (08/08 0818) BP: (179)/(76) 179/76 mmHg (08/08 0818) SpO2:  [97 %] 97 % (08/08 0818)  Labs:   Estimated body mass index is 46.78 kg/(m^2) as calculated from the following:   Height as of 09/17/12: 5\' 8"  (1.727 m).   Weight as of 05/09/12: 139.509 kg (307 lb 9 oz).   Imaging Review Plain radiographs demonstrate severe degenerative joint disease of the left knee(s). The overall alignment ismild varus. The bone quality appears to be good for age and reported activity level.  Assessment/Plan:  End stage arthritis, left knee   The patient history, physical examination, clinical judgment of the provider and imaging studies are consistent with end stage degenerative joint disease of the left knee(s) and total knee  arthroplasty is deemed medically necessary. The treatment options including medical management, injection therapy arthroscopy and arthroplasty were discussed at length. The risks and benefits of total knee arthroplasty were presented and reviewed. The risks due to aseptic loosening, infection, stiffness, patella tracking problems, thromboembolic complications and other imponderables were discussed. The patient acknowledged the explanation, agreed to proceed with the plan and consent was signed. Patient is being admitted for inpatient treatment for surgery, pain control, PT, OT, prophylactic antibiotics, VTE prophylaxis, progressive ambulation and ADL's and discharge planning. The patient is planning to be discharged home with home health services

## 2012-09-26 NOTE — Anesthesia Postprocedure Evaluation (Signed)
  Anesthesia Post-op Note  Patient: [name]  Procedure(s) Performed: Procedure(s) with comments: TOTAL KNEE ARTHROPLASTY (Left) - GENERAL ANESTHESIA WITH PRE OP FEMORAL NRVE BLOCK   Patient Location: PACU  Anesthesia Type:General  Level of Consciousness: awake  Airway and Oxygen Therapy: Patient Spontanous Breathing  Post-op Pain: mild  Post-op Assessment: Post-op Vital signs reviewed  Post-op Vital Signs: Reviewed  Complications: No apparent anesthesia complications

## 2012-09-26 NOTE — Progress Notes (Signed)
Patient setup on BiPAP 20/10 per home settings. With 2l O2 bleed in. Tolerated well. Can place himself on and off

## 2012-09-26 NOTE — Anesthesia Preprocedure Evaluation (Signed)
Anesthesia Evaluation  Patient identified by MRN, date of birth, ID band Patient awake    Reviewed: Allergy & Precautions, H&P , NPO status , Patient's Chart, lab work & pertinent test results  Airway Mallampati: II      Dental   Pulmonary sleep apnea and Continuous Positive Airway Pressure Ventilation ,          Cardiovascular hypertension, Pt. on medications     Neuro/Psych negative neurological ROS     GI/Hepatic negative GI ROS, Neg liver ROS,   Endo/Other  diabetes  Renal/GU negative Renal ROS     Musculoskeletal   Abdominal   Peds  Hematology   Anesthesia Other Findings   Reproductive/Obstetrics                           Anesthesia Physical Anesthesia Plan  ASA: III  Anesthesia Plan: General   Post-op Pain Management:    Induction: Intravenous  Airway Management Planned: Oral ETT  Additional Equipment:   Intra-op Plan:   Post-operative Plan: Extubation in OR  Informed Consent: I have reviewed the patients History and Physical, chart, labs and discussed the procedure including the risks, benefits and alternatives for the proposed anesthesia with the patient or authorized representative who has indicated his/her understanding and acceptance.   Dental advisory given  Plan Discussed with: CRNA, Anesthesiologist and Surgeon  Anesthesia Plan Comments:         Anesthesia Quick Evaluation

## 2012-09-26 NOTE — Brief Op Note (Signed)
09/26/2012  12:47 PM  PATIENT:  [name]  57 y.o. male  PRE-OPERATIVE DIAGNOSIS:  degenerative joint disease  POST-OPERATIVE DIAGNOSIS:  degenrative joint disease  PROCEDURE:  Procedure(s) with comments: TOTAL KNEE ARTHROPLASTY (Left) - GENERAL ANESTHESIA WITH PRE OP FEMORAL NRVE BLOCK   SURGEON:  Surgeon(s) and Role:    * Harvie Junior, MD - Primary  PHYSICIAN ASSISTANT:   ASSISTANTS: bethune   ANESTHESIA:   general  EBL:  Total I/O In: 1000 [I.V.:1000] Out: 300 [Urine:225; Blood:75]  BLOOD ADMINISTERED:none  DRAINS: (1) Hemovact drain(s) in the l knee with  Suction Open   LOCAL MEDICATIONS USED:  NONE  SPECIMEN:  No Specimen  DISPOSITION OF SPECIMEN:  N/A  COUNTS:  YES  TOURNIQUET:   Total Tourniquet Time Documented: Thigh (Left) - 96 minutes Total: Thigh (Left) - 96 minutes   DICTATION: .Other Dictation: Dictation Number [id number]  PLAN OF CARE: Admit to inpatient   PATIENT DISPOSITION:  PACU - hemodynamically stable.   Delay start of Pharmacological VTE agent (>24hrs) due to surgical blood loss or risk of bleeding: no

## 2012-09-26 NOTE — Preoperative (Signed)
Beta Blockers   Reason not to administer Beta Blockers:Not Applicable 

## 2012-09-26 NOTE — Progress Notes (Signed)
Orthopedic Tech Progress Note Patient Details:  [name] 12-Dec-1955 [id number]  CPM Left Knee CPM Left Knee: On Left Knee Flexion (Degrees): 60 Left Knee Extension (Degrees): 0 Additional Comments: trapeze bar patient helper Viewed order from rn order list  [name] 09/26/2012, 2:09 PM

## 2012-09-26 NOTE — Anesthesia Procedure Notes (Addendum)
Procedure Name: Intubation Date/Time: 09/26/2012 10:32 AM Performed by: Armandina Gemma Pre-anesthesia Checklist: Patient identified, Timeout performed, Emergency Drugs available, Suction available and Patient being monitored Patient Re-evaluated:Patient Re-evaluated prior to inductionOxygen Delivery Method: Circle system utilized Preoxygenation: Pre-oxygenation with 100% oxygen Intubation Type: IV induction, Rapid sequence and Cricoid Pressure applied Ventilation: Mask ventilation without difficulty Laryngoscope Size: Miller and 2 Grade View: Grade I Tube type: Oral Tube size: 7.5 mm Number of attempts: 1 Airway Equipment and Method: Stylet and LTA kit utilized Placement Confirmation: ETT inserted through vocal cords under direct vision,  breath sounds checked- equal and bilateral and positive ETCO2 Secured at: 22 cm Tube secured with: Tape Dental Injury: Teeth and Oropharynx as per pre-operative assessment  Comments: IV induction Edwards AM CRNA atraumatic- teeth  and mouth as preop   Anesthesia Regional Block:  Femoral nerve block  Pre-Anesthetic Checklist: ,, timeout performed, Correct Patient, Correct Site, Correct Laterality, Correct Procedure, Correct Position, site marked, Risks and benefits discussed,  Surgical consent,  Pre-op evaluation,  At surgeon's request and post-op pain management  Laterality: Left  Prep: chloraprep       Needles:   Needle Type: Echogenic Stimulator Needle          Additional Needles:  Procedures: Doppler guided and nerve stimulator Femoral nerve block  Nerve Stimulator or Paresthesia:  Response: 0.5 mA,   Additional Responses:   Narrative:  Start time: 09/26/2012 10:00 AM End time: 09/26/2012 10:15 AM Injection made incrementally with aspirations every 5 mL.  Performed by: Personally  Anesthesiologist: Dr. Randa Evens  Femoral nerve block

## 2012-09-27 LAB — CBC
HCT: 36.3 % — ABNORMAL LOW (ref 39.0–52.0)
Hemoglobin: 12.1 g/dL — ABNORMAL LOW (ref 13.0–17.0)
MCH: 27.8 pg (ref 26.0–34.0)
MCV: 83.3 fL (ref 78.0–100.0)
RBC: 4.36 MIL/uL (ref 4.22–5.81)
WBC: 14 10*3/uL — ABNORMAL HIGH (ref 4.0–10.5)

## 2012-09-27 LAB — BASIC METABOLIC PANEL
CO2: 28 mEq/L (ref 19–32)
Calcium: 8.5 mg/dL (ref 8.4–10.5)
Chloride: 100 mEq/L (ref 96–112)
Creatinine, Ser: 0.9 mg/dL (ref 0.50–1.35)
Glucose, Bld: 230 mg/dL — ABNORMAL HIGH (ref 70–99)

## 2012-09-27 LAB — GLUCOSE, CAPILLARY

## 2012-09-27 NOTE — Progress Notes (Signed)
PATIENT ID: [name]   1 Day Post-Op Procedure(s) (LRB): TOTAL KNEE ARTHROPLASTY (Left)  Subjective: Reports soreness in left knee this am but tolerating pain well with current pain mgmt. Ready to get up with PT today.   Objective:  Filed Vitals:   09/27/12 0545  BP: 134/56  Pulse: 80  Temp: 99 F (37.2 C)  Resp: 20     L knee dressing c/d/i  Drain removed today  Mild swelling distally, calf soft, nontender  Wiggles toes, distally nvi   Labs:   Recent Labs  09/27/12 0455  HGB 12.1*   Recent Labs  09/27/12 0455  WBC 14.0*  RBC 4.36  HCT 36.3*  PLT 242   Recent Labs  09/27/12 0455  NA 136  K 3.8  CL 100  CO2 28  BUN 15  CREATININE 0.90  GLUCOSE 230*  CALCIUM 8.5    Assessment and Plan: 1 day s/p left TKA WBAT Up with PT today Cont current pain mgmt Likely home tomorrow if cleared by PT  VTE proph: ASA 325mg  BID, SCDs

## 2012-09-27 NOTE — Op Note (Signed)
[name], [name]              ACCOUNT NO.:  [id number]  MEDICAL RECORD NO.:  [id number]  LOCATION:  [id number]                        FACILITY:  [facility]  PHYSICIAN:  Harvie Junior, M.D.   DATE OF BIRTH:  September 16, 1955  DATE OF PROCEDURE:  09/26/2012 DATE OF DISCHARGE:                              OPERATIVE REPORT   PREOPERATIVE DIAGNOSES: 1. End-stage degenerative joint disease, left knee. 2. Morbid obesity.  POSTOPERATIVE DIAGNOSES: 1. End-stage degenerative joint disease, left knee. 2. Morbid obesity.  PROCEDURE: 1. Left total knee replacement with Sigma system, size 4 femur, size 4     tibia, 10 mm bridging bearing, and a 41 mm all polyethylene     patella. 2. Complex total knee replacement due to morbid obesity.  SURGEON:  Harvie Junior, M.D.  ASSISTANT:  Marshia Ly, P.A.  ANESTHESIA:  General.  BRIEF HISTORY:  Mr. [name] is a 57 year old male with a long history of having severe complaints of left knee pain.  He had been treated conservatively for a prolonged period of time.  X-rays show bone-on-bone changes.  Having failed conservative care including injection therapy, activity modification, weight loss, use of a cane, he was having severe night pain, and x-rays show bone-on-bone changes.  After failure of all conservative care, he was taken to the operating room for left total knee replacement.  We knew that it would be a difficult case because of his morbid obesity and this was understood preoperatively that we had to get a third person scrub in because of his large size.  PROCEDURE:  The patient was taken to the operating room.  After adequate anesthesia was obtained with general anesthetic, the patient was placed supine on the operating table.  The left leg was then prepped and draped in usual sterile fashion.  Following this, the leg was exsanguinated. Blood pressure tourniquet inflated to 350 mmHg.  Following this, a midline incision was made in the  subcutaneous tissues down the level of the extensor mechanism and a medial parapatellar arthrotomy was undertaken.  Once this was completed, attention was turned to the left knee where medial and lateral meniscus were removed, retropatellar fat pad, synovium in the anterior aspect of the femur, and anterior and posterior cruciates.  Once this was done, the knee was exposed, the tibia was cut perpendicular to its long axis.  The femur was then drilled and with an intramedullary guide we cut the femur with a 5- degree valgus alignment.  Once this was done, the femur was sized to a 4, anterior and posterior cuts were made as well as chamfers and box. Attention was turned to the tibia, sized to a 4,  was drilled and keeled and the trial components were put in place with a 10 mm bridging bearing, extreme difficulty with all these cuts because of the large size certainly adding time to the case at each step because of difficulty with retracting, in fact trying to get the tibia exposed was almost impossible due to the inability to get the leg flexed back because of his large size.  We finally were able to get exposed as the final trial components were put in as outlined, patella was  cut down to a level of 13 mm and a 41 lugs were drilled for 41 poly and 41 poly trial was put in place.  Knee put through a range of motion.  Did have a little bit of medial side liftoff, but with towel clips seemed to be stable.  At this point, the trial components were removed.  Knee was copiously and thoroughly lavaged, suctioned dry.  The final components were then cemented into place, size 4 tibia, size 4 femur, 10 mm bridging bearing trial, 41 all poly patella was held with a clamp.  Once the cement was allowed to completely harden, the tourniquet was let down and all bleeding was controlled with electrocautery.  The trial was removed.  I tried a 12.5 poly just to see, but really it not would not come out to  extension and flexion was extremely tight fit.  At this point, we went back to the 10 poly and put the final poly in place.  The knee was again irrigated and suctioned dry.  The medial retinaculum was closed with 1-Vicryl running, skin with 0 and 2-0 Vicryl and skin staples.  Sterile compressive dressing was applied.  The patient was taken to the recovery room and was noted to be in a satisfactory condition.  Estimated blood loss for the procedure was minimal.  Because of the patient's extreme morbid obesity with a BMI of greater than 48, the case was extremely difficult and certainly took about 45 minutes longer than normal because of the large size of the patient. The difficulty was with both retraction and visualization, but ultimately we were able to get a nice a total knee in the patient.     Harvie Junior, M.D.     Ranae Plumber  D:  09/26/2012  T:  09/27/2012  Job:  [id number]

## 2012-09-27 NOTE — Evaluation (Signed)
Physical Therapy Evaluation Patient Details Name: [name] MRN: [id number] DOB: 08/15/1955 Today's Date: 09/27/2012 Time: 1331-1350 PT Time Calculation (min): 19 min  PT Assessment / Plan / Recommendation History of Present Illness  Patient is a 57 yo male s/p Lt TKA.  Clinical Impression  Patient presents with problems listed below.  Will benefit from acute PT to maximize independence prior to return home with wife.    PT Assessment  Patient needs continued PT services    Follow Up Recommendations  Home health PT;Supervision/Assistance - 24 hour    Does the patient have the potential to tolerate intense rehabilitation      Barriers to Discharge        Equipment Recommendations  None recommended by PT    Recommendations for Other Services     Frequency 7X/week    Precautions / Restrictions Precautions Precautions: Knee Precaution Comments: Reviewed precautions with patient and wife. Required Braces or Orthoses: Knee Immobilizer - Left Knee Immobilizer - Left: On when out of bed or walking;Discontinue once straight leg raise with < 10 degree lag Restrictions Weight Bearing Restrictions: Yes LLE Weight Bearing: Weight bearing as tolerated   Pertinent Vitals/Pain       Mobility  Bed Mobility Bed Mobility: Supine to Sit;Sitting - Scoot to Edge of Bed Supine to Sit: 1: +2 Total assist;HOB flat Supine to Sit: Patient Percentage: 60% Sitting - Scoot to Edge of Bed: 3: Mod assist;With rail Details for Bed Mobility Assistance: Instructed patient on donning KI. Verbal cues for technique.  Assist to move LLE off of bed and raise trunk to sitting.  Patient with dyspnea of 3/4.  Rest break in sitting. Transfers Transfers: Sit to Stand;Stand to Sit Sit to Stand: 1: +2 Total assist;With upper extremity assist;From bed Sit to Stand: Patient Percentage: 70% Stand to Sit: 1: +2 Total assist;With upper extremity assist;With armrests;To chair/3-in-1 Stand to Sit: Patient  Percentage: 70% Details for Transfer Assistance: Verbal cues for hand placement and placement of LLE during transfers.  Assist to rise to standing. Ambulation/Gait Ambulation/Gait Assistance: 1: +2 Total assist Ambulation/Gait: Patient Percentage: 80% Ambulation Distance (Feet): 8 Feet Assistive device: Rolling walker Ambulation/Gait Assistance Details: Verbal cues for safe use of RW and gait sequence.   Gait Pattern: Step-to pattern;Decreased stance time - left;Decreased step length - right;Antalgic;Trunk flexed    Exercises Total Joint Exercises Ankle Circles/Pumps: AROM;Both;10 reps;Seated   PT Diagnosis: Difficulty walking;Generalized weakness;Acute pain  PT Problem List: Decreased strength;Decreased range of motion;Decreased activity tolerance;Decreased balance;Decreased mobility;Decreased knowledge of use of DME;Decreased knowledge of precautions;Cardiopulmonary status limiting activity;Obesity;Pain PT Treatment Interventions: DME instruction;Gait training;Functional mobility training;Therapeutic exercise;Patient/family education     PT Goals(Current goals can be found in the care plan section) Acute Rehab PT Goals Patient Stated Goal: To return home PT Goal Formulation: With patient/family Time For Goal Achievement: 10/04/12 Potential to Achieve Goals: Good  Visit Information  Last PT Received On: 09/27/12 Assistance Needed: +2 History of Present Illness: Patient is a 57 yo male s/p Lt TKA.       Prior Functioning  Home Living Family/patient expects to be discharged to:: Private residence Living Arrangements: Spouse/significant other Available Help at Discharge: Family;Available 24 hours/day Type of Home: House Home Access: Stairs to enter [employer] of Steps: 1 Entrance Stairs-Rails: None Home Layout: One level Home Equipment: Walker - 2 wheels;Bedside commode;Shower seat Prior Function Level of Independence: Independent Communication Communication:  No difficulties    Cognition  Cognition Arousal/Alertness: Awake/alert Behavior During Therapy: WFL for  tasks assessed/performed Overall Cognitive Status: Within Functional Limits for tasks assessed    Extremity/Trunk Assessment Upper Extremity Assessment Upper Extremity Assessment: Overall WFL for tasks assessed Lower Extremity Assessment Lower Extremity Assessment: Generalized weakness;LLE deficits/detail LLE Deficits / Details: Decreased strength and ROM due to surgery LLE: Unable to fully assess due to pain   Balance    End of Session PT - End of Session Equipment Utilized During Treatment: Gait belt;Left knee immobilizer Activity Tolerance: Patient limited by pain;Patient limited by fatigue Patient left: in chair;with call bell/phone within reach;with family/visitor present Nurse Communication: Mobility status CPM Left Knee CPM Left Knee: Off  GP     Vena Austria 09/27/2012, 3:51 PM Durenda Hurt. Renaldo Fiddler, [facility] Pager [phone]

## 2012-09-28 LAB — GLUCOSE, CAPILLARY
Glucose-Capillary: 174 mg/dL — ABNORMAL HIGH (ref 70–99)
Glucose-Capillary: 198 mg/dL — ABNORMAL HIGH (ref 70–99)
Glucose-Capillary: 214 mg/dL — ABNORMAL HIGH (ref 70–99)

## 2012-09-28 LAB — CBC
HCT: 35 % — ABNORMAL LOW (ref 39.0–52.0)
Hemoglobin: 11.8 g/dL — ABNORMAL LOW (ref 13.0–17.0)
MCHC: 33.7 g/dL (ref 30.0–36.0)
RDW: 15 % (ref 11.5–15.5)
WBC: 18.6 10*3/uL — ABNORMAL HIGH (ref 4.0–10.5)

## 2012-09-28 LAB — BASIC METABOLIC PANEL
BUN: 10 mg/dL (ref 6–23)
Chloride: 100 mEq/L (ref 96–112)
GFR calc Af Amer: >90 mL/min (ref >90)
GFR calc non Af Amer: >90 mL/min (ref >90)
Glucose, Bld: 210 mg/dL — ABNORMAL HIGH (ref 70–99)
Potassium: 3.6 mEq/L (ref 3.5–5.1)
Sodium: 137 mEq/L (ref 135–145)

## 2012-09-28 MED ORDER — OXYCODONE-ACETAMINOPHEN 5-325 MG PO TABS: 1.0000 | ORAL_TABLET | ORAL | Status: DC | PRN

## 2012-09-28 MED ORDER — ASPIRIN EC 325 MG PO TBEC: 325.0000 mg | DELAYED_RELEASE_TABLET | Freq: Two times a day (BID) | ORAL | Status: AC

## 2012-09-28 MED ORDER — METHOCARBAMOL 500 MG PO TABS: 500.0000 mg | ORAL_TABLET | Freq: Three times a day (TID) | ORAL | Status: DC

## 2012-09-28 NOTE — Progress Notes (Signed)
PATIENT ID: [name]   2 Days Post-Op Procedure(s) (LRB): TOTAL KNEE ARTHROPLASTY (Left)  Subjective: Reports pain is a 3/10 and feels like it is well controlled. Ready to work with PT again today and ready to go home. No other complaints or concerns.  Objective:  Filed Vitals:   09/28/12 0712  BP: 156/61  Pulse: 86  Temp: 98.4 F (36.9 C)  Resp: 16     L knee dressing removed today and mepliex applied  Incision benign, no erythema, warmth  Mod swelling distally, calf soft, nontender  Wiggles toes, distally nvi   Labs:   Recent Labs  09/27/12 0455 09/28/12 0535  HGB 12.1* 11.8*   Recent Labs  09/27/12 0455 09/28/12 0535  WBC 14.0* 18.6*  RBC 4.36 4.24  HCT 36.3* 35.0*  PLT 242 221   Recent Labs  09/27/12 0455  NA 136  K 3.8  CL 100  CO2 28  BUN 15  CREATININE 0.90  GLUCOSE 230*  CALCIUM 8.5    Assessment and Plan: 2 days s/p left TKA  WBAT, CPM Can d/c home today if cleared by PT, will receive home health PT and that is in place Cont current pain mgmt Scripts for Percocet, Robaxin and ASA in chart ASA 325mg  BID x4 weeks for home DVT prophylaxis   VTE proph: ASA 325mg  BID, SCDs

## 2012-09-28 NOTE — Progress Notes (Signed)
Reviewed and agree with Benjamin Stain, SPT;   Pt will have adequate assist at home; OK for dc home from a functional mobility standpoint; Van Clines, PT [phone]

## 2012-09-28 NOTE — Progress Notes (Signed)
Physical Therapy Treatment Patient Details Name: [name] MRN: [id number] DOB: [birth year] Today's Date: 09/28/2012 Time: 0865-7846 PT Time Calculation (min): 28 min  PT Assessment / Plan / Recommendation  History of Present Illness Patient is a 57 yo male s/p Lt TKA.   PT Comments   Pt admitted with above and symptomatic with SOB upon sitting EOB. Pt demonstrated stair technique safely.  Pt ambulated without knee immobilizer and reported knee felt stable. Pt SOB and fatigued post-ambulation.  D/C home with HHPT and 24 hour supervision remains appropriate.   Follow Up Recommendations  Home health PT;Supervision/Assistance - 24 hour           Equipment Recommendations  None recommended by PT       Frequency 7X/week   Progress towards PT Goals Progress towards PT goals: Progressing toward goals  Plan Current plan remains appropriate    Precautions / Restrictions Precautions Precautions: Knee;Fall Restrictions Weight Bearing Restrictions: Yes LLE Weight Bearing: Weight bearing as tolerated   Pertinent Vitals/Pain R knee pain at rest: 3/10; with RLE WB 5/10; post-ambulation: 6/10 Pt repositioned for comfort.    Mobility  Bed Mobility Bed Mobility: Supine to Sit;Sitting - Scoot to Edge of Bed Supine to Sit: 4: Min assist;With rails;HOB flat (PT elevated leg and assisted at RUE. Pt used rail with LUE) Sitting - Scoot to Edge of Bed: 4: Min assist;With rail (PT elevated RLE) Transfers Transfers: Sit to Stand;Stand to Sit Sit to Stand: 4: Min assist;With upper extremity assist;From bed Stand to Sit: 4: Min assist;With upper extremity assist;With armrests;To chair/3-in-1 Details for Transfer Assistance: Verbal cues for hand placement and RLE positioning. Ambulation/Gait Ambulation/Gait Assistance: 4: Min guard Ambulation Distance (Feet): 50 Feet Assistive device: Rolling walker Ambulation/Gait Assistance Details: Cues for upright posture and walker technique. Gait  Pattern: Step-to pattern;Decreased stride length;Antalgic;Trunk flexed Gait velocity: decreased General Gait Details: Gait speed decreased and pt breathing heavily during last 20 ft of ambulation. Stairs: Yes Stairs Assistance: 4: Min guard Stair Management Technique: No rails;Backwards;With walker Number of Stairs: 1 Wheelchair Mobility Wheelchair Mobility: No      PT Goals (current goals can now be found in the care plan section) Acute Rehab PT Goals Patient Stated Goal: To return home PT Goal Formulation: With patient/family Time For Goal Achievement: 10/04/12 Potential to Achieve Goals: Good  Visit Information  Last PT Received On: 09/28/12 Assistance Needed: +1 History of Present Illness: Patient is a 57 yo male s/p Lt TKA.    Subjective Data  Patient Stated Goal: To return home   Cognition  Cognition Arousal/Alertness: Awake/alert Behavior During Therapy: WFL for tasks assessed/performed Overall Cognitive Status: Within Functional Limits for tasks assessed    Balance  Balance Balance Assessed: No  End of Session PT - End of Session Equipment Utilized During Treatment: Gait belt Activity Tolerance: Patient limited by pain;Patient tolerated treatment well;Patient limited by fatigue Patient left: in chair;with call bell/phone within reach;with family/visitor present Nurse Communication: Mobility status;Other (comment) Engineer, materials in IV line)   GP     Benjamin Stain 09/28/2012, 10:56 AM

## 2012-09-29 ENCOUNTER — Encounter (HOSPITAL_COMMUNITY): Payer: Self-pay | Admitting: Orthopedic Surgery

## 2012-09-29 NOTE — Discharge Summary (Signed)
Patient ID: [name] MRN: [id number] DOB/AGE: 56-Aug-1957 56 y.o.  Admit date: 09/26/2012 Discharge date: 09/28/12 Admission Diagnoses:  Principal Problem:   Osteoarthritis of left knee Active Problems:   Severe obesity (BMI >= 40)   Discharge Diagnoses:  Same  Past Medical History  Diagnosis Date  . Arthritis   . Hyperlipemia     takes Lipitor daily  . Hypertension     takes Exforge daily  . Peripheral edema     takes Lasix daily  . Sleep apnea     uses bipap  . Joint pain   . Joint swelling   . Diabetes mellitus     takes Janumet and AMaryl daily    Surgeries: Procedure(s):Left TOTAL KNEE ARTHROPLASTY on 09/26/2012    Discharged Condition: Improved  Hospital Course: [name] is an 57 y.o. male who was admitted 09/26/2012 for operative treatment ofOsteoarthritis of left knee. Patient has severe unremitting pain that affects sleep, daily activities, and work/hobbies. After pre-op clearance the patient was taken to the operating room on 09/26/2012 and underwent  Procedure(s):LEFT TOTAL KNEE ARTHROPLASTY.    Patient was given perioperative antibiotics: Anti-infectives   Start     Dose/Rate Route Frequency Ordered Stop   09/26/12 1800  ceFAZolin (ANCEF) IVPB 2 g/50 mL premix     2 g 100 mL/hr over 30 Minutes Intravenous 4 times per day 09/26/12 1748 09/26/12 2359   09/26/12 1645  ceFAZolin (ANCEF) 3 g in dextrose 5 % 50 mL IVPB  Status:  Discontinued     3 g 160 mL/hr over 30 Minutes Intravenous Every 6 hours 09/26/12 1643 09/26/12 1747   09/26/12 1149  cefUROXime (ZINACEF) injection  Status:  Discontinued       As needed 09/26/12 1151 09/26/12 1309   09/26/12 0600  ceFAZolin (ANCEF) 3 g in dextrose 5 % 50 mL IVPB     3 g 160 mL/hr over 30 Minutes Intravenous 30 min pre-op 09/25/12 1425 09/26/12 1035       Patient was given sequential compression devices, early ambulation, and chemoprophylaxis to prevent DVT.  Patient benefited maximally from hospital  stay and there were no complications.    Recent vital signs: see chart   Recent laboratory studies:  Recent Labs  09/27/12 0455 09/28/12 0535  WBC 14.0* 18.6*  HGB 12.1* 11.8*  HCT 36.3* 35.0*  PLT 242 221  NA 136 137  K 3.8 3.6  CL 100 100  CO2 28 26  BUN 15 10  CREATININE 0.90 0.71  GLUCOSE 230* 210*  CALCIUM 8.5 8.6     Discharge Medications:     Medication List         amLODipine-valsartan 10-320 MG per tablet  Commonly known as:  EXFORGE  Take 1 tablet by mouth daily.     aspirin EC 325 MG tablet  Take 1 tablet (325 mg total) by mouth 2 (two) times daily.     atorvastatin 40 MG tablet  Commonly known as:  LIPITOR  Take 40 mg by mouth 3 (three) times a week. *doesn't take every day because of muscle fatigue     celecoxib 200 MG capsule  Commonly known as:  CELEBREX  Take 200 mg by mouth daily.     furosemide 20 MG tablet  Commonly known as:  LASIX  Take 20 mg by mouth daily.     glimepiride 4 MG tablet  Commonly known as:  AMARYL  Take 4 mg by mouth 2 (two) times daily.  methocarbamol 500 MG tablet  Commonly known as:  ROBAXIN  Take 1 tablet (500 mg total) by mouth 3 (three) times daily.     multivitamin with minerals Tabs tablet  Take 1 tablet by mouth daily.     oxyCODONE-acetaminophen 5-325 MG per tablet  Commonly known as:  ROXICET  Take 1-2 tablets by mouth every 4 (four) hours as needed for pain.     sitaGLIPtan-metformin 50-1000 MG per tablet  Commonly known as:  JANUMET  Take 1 tablet by mouth 2 (two) times daily with a meal.        Diagnostic Studies: No results found.  Disposition: 01-Home or Self Care      Discharge Orders   Future Orders Complete By Expires     Call MD / Call 911  As directed     Comments:      If you experience chest pain or shortness of breath, CALL 911 and be transported to the hospital emergency room.  If you develope a fever above 101 F, pus (white drainage) or increased drainage or redness at the  wound, or calf pain, call your surgeon's office.    Constipation Prevention  As directed     Comments:      Drink plenty of fluids.  Prune juice may be helpful.  You may use a stool softener, such as Colace (over the counter) 100 mg twice a day.  Use MiraLax (over the counter) for constipation as needed.    Diet - low sodium heart healthy  As directed     Increase activity slowly as tolerated  As directed          Diabetic Diet  Follow-up Information   Follow up with GRAVES,JOHN L, MD. Schedule an appointment as soon as possible for a visit in 2 weeks.   Contact information:   [address] [place] [address] [phone]        Signed: Matthew Folks 09/29/2012, 4:36 PM

## 2012-12-23 ENCOUNTER — Encounter (HOSPITAL_BASED_OUTPATIENT_CLINIC_OR_DEPARTMENT_OTHER): Payer: BC Managed Care – PPO

## 2012-12-25 ENCOUNTER — Other Ambulatory Visit: Payer: Self-pay

## 2013-07-15 ENCOUNTER — Ambulatory Visit (INDEPENDENT_AMBULATORY_CARE_PROVIDER_SITE_OTHER): Payer: BC Managed Care – PPO | Admitting: Podiatrist

## 2013-07-15 ENCOUNTER — Encounter: Payer: Self-pay | Admitting: Podiatrist

## 2013-07-15 VITALS — BP 122/74 | HR 86 | Resp 20 | Ht 69.0 in | Wt 298.0 lb

## 2013-07-15 DIAGNOSIS — E119 Type 2 diabetes mellitus without complications: Secondary | ICD-10-CM

## 2013-07-15 NOTE — Progress Notes (Signed)
   Subjective:    Patient ID: [name], male    DOB: 1955-03-03, 58 y.o.   MRN: [id number]  HPI Comments: Diabetes, need a foot exam. Been diabetic for 10 years or more. Last a1c 7.8   Diabetes      Review of Systems  Endocrine:       Diabetes   All other systems reviewed and are negative.      Objective:   Physical Exam  GENERAL APPEARANCE: Alert, conversant. Appropriately groomed. No acute distress.  VASCULAR: Pedal pulses palpable at 2/4 DP and PT bilateral.  Capillary refill time is immediate to all digits,  Proximal to distal cooling it warm to warm.  Digital hair growth is present bilateral  NEUROLOGIC: sensation is intact epicritically and protectively to 5.07 monofilament at 5/5 sites bilateral.  Light touch is intact bilateral, vibratory sensation intact bilateral, achilles tendon reflex is intact bilateral.  MUSCULOSKELETAL: acceptable muscle strength, tone and stability bilateral.  Intrinsic muscluature intact bilateral.  Rectus appearance of foot and digits noted bilateral.   DERMATOLOGIC: skin color, texture, and turger are within normal limits.  No preulcerative lesions are seen, no interdigital maceration noted.  No open lesions present.  Digital nails are asymptomatic.         Assessment & Plan:  Diabetes under good control with no class findings and normal foot exam  Plan:  Discussed diabetes and they way in which it effects the feet.  Dispensed instructions on diabetic foot care.  He will be seen back as needed for follow up and will call if any problems or concerns arise.

## 2013-07-15 NOTE — Patient Instructions (Signed)
Diabetes and Foot Care Diabetes may cause you to have problems because of poor blood supply (circulation) to your feet and legs. This may cause the skin on your feet to become thinner, break easier, and heal more slowly. Your skin may become dry, and the skin may peel and crack. You may also have nerve damage in your legs and feet causing decreased feeling in them. You may not notice minor injuries to your feet that could lead to infections or more serious problems. Taking care of your feet is one of the most important things you can do for yourself.  HOME CARE INSTRUCTIONS  Wear shoes at all times, even in the house. Do not go barefoot. Bare feet are easily injured.  Check your feet daily for blisters, cuts, and redness. If you cannot see the bottom of your feet, use a mirror or ask someone for help.  Wash your feet with warm water (do not use hot water) and mild soap. Then pat your feet and the areas between your toes until they are completely dry. Do not soak your feet as this can dry your skin.  Apply a moisturizing lotion or petroleum jelly (that does not contain alcohol and is unscented) to the skin on your feet and to dry, brittle toenails. Do not apply lotion between your toes.  Trim your toenails straight across. Do not dig under them or around the cuticle. File the edges of your nails with an emery board or nail file.  Do not cut corns or calluses or try to remove them with medicine.  Wear clean socks or stockings every day. Make sure they are not too tight. Do not wear knee-high stockings since they may decrease blood flow to your legs.  Wear shoes that fit properly and have enough cushioning. To break in new shoes, wear them for just a few hours a day. This prevents you from injuring your feet. Always look in your shoes before you put them on to be sure there are no objects inside.  Do not cross your legs. This may decrease the blood flow to your feet.  If you find a minor scrape,  cut, or break in the skin on your feet, keep it and the skin around it clean and dry. These areas may be cleansed with mild soap and water. Do not cleanse the area with peroxide, alcohol, or iodine.  When you remove an adhesive bandage, be sure not to damage the skin around it.  If you have a wound, look at it several times a day to make sure it is healing.  Do not use heating pads or hot water bottles. They may burn your skin. If you have lost feeling in your feet or legs, you may not know it is happening until it is too late.  Make sure your health care provider performs a complete foot exam at least annually or more often if you have foot problems. Report any cuts, sores, or bruises to your health care provider immediately. SEEK MEDICAL CARE IF:   You have an injury that is not healing.  You have cuts or breaks in the skin.  You have an ingrown nail.  You notice redness on your legs or feet.  You feel burning or tingling in your legs or feet.  You have pain or cramps in your legs and feet.  Your legs or feet are numb.  Your feet always feel cold. SEEK IMMEDIATE MEDICAL CARE IF:   There is increasing redness,   swelling, or pain in or around a wound.  There is a red line that goes up your leg.  Pus is coming from a wound.  You develop a fever or as directed by your health care provider.  You notice a bad smell coming from an ulcer or wound. Document Released: 02/03/2000 Document Revised: 10/08/2012 Document Reviewed: 07/15/2012 [employer] Patient Information 2014 [employer].  

## 2013-08-31 IMAGING — CR DG CHEST 2V
2 series · 2 of 2 positions shown · non-contrast
Comparison: Portable chest x-ray of 11/13/2005

CLINICAL DATA: Preop for hip replacement

CHEST - 2 VIEW

[view not recorded (1 of 2)]
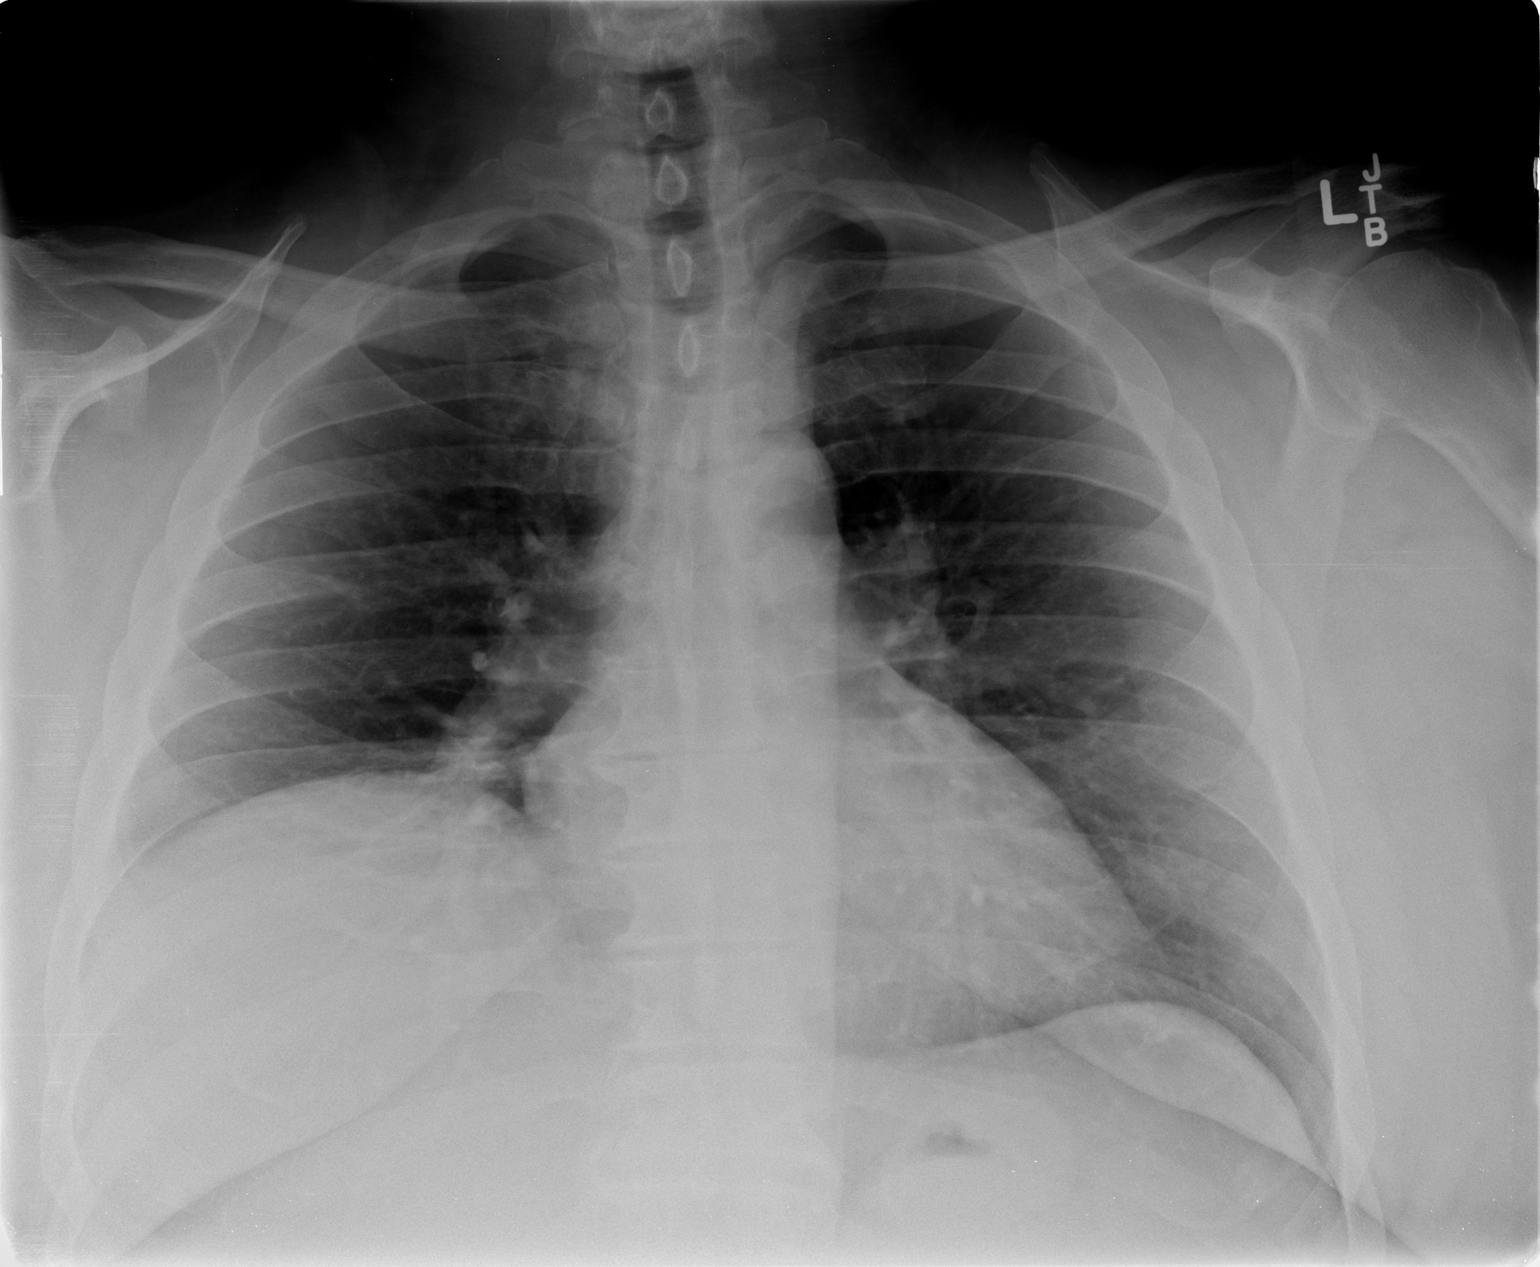

[view not recorded (2 of 2)]
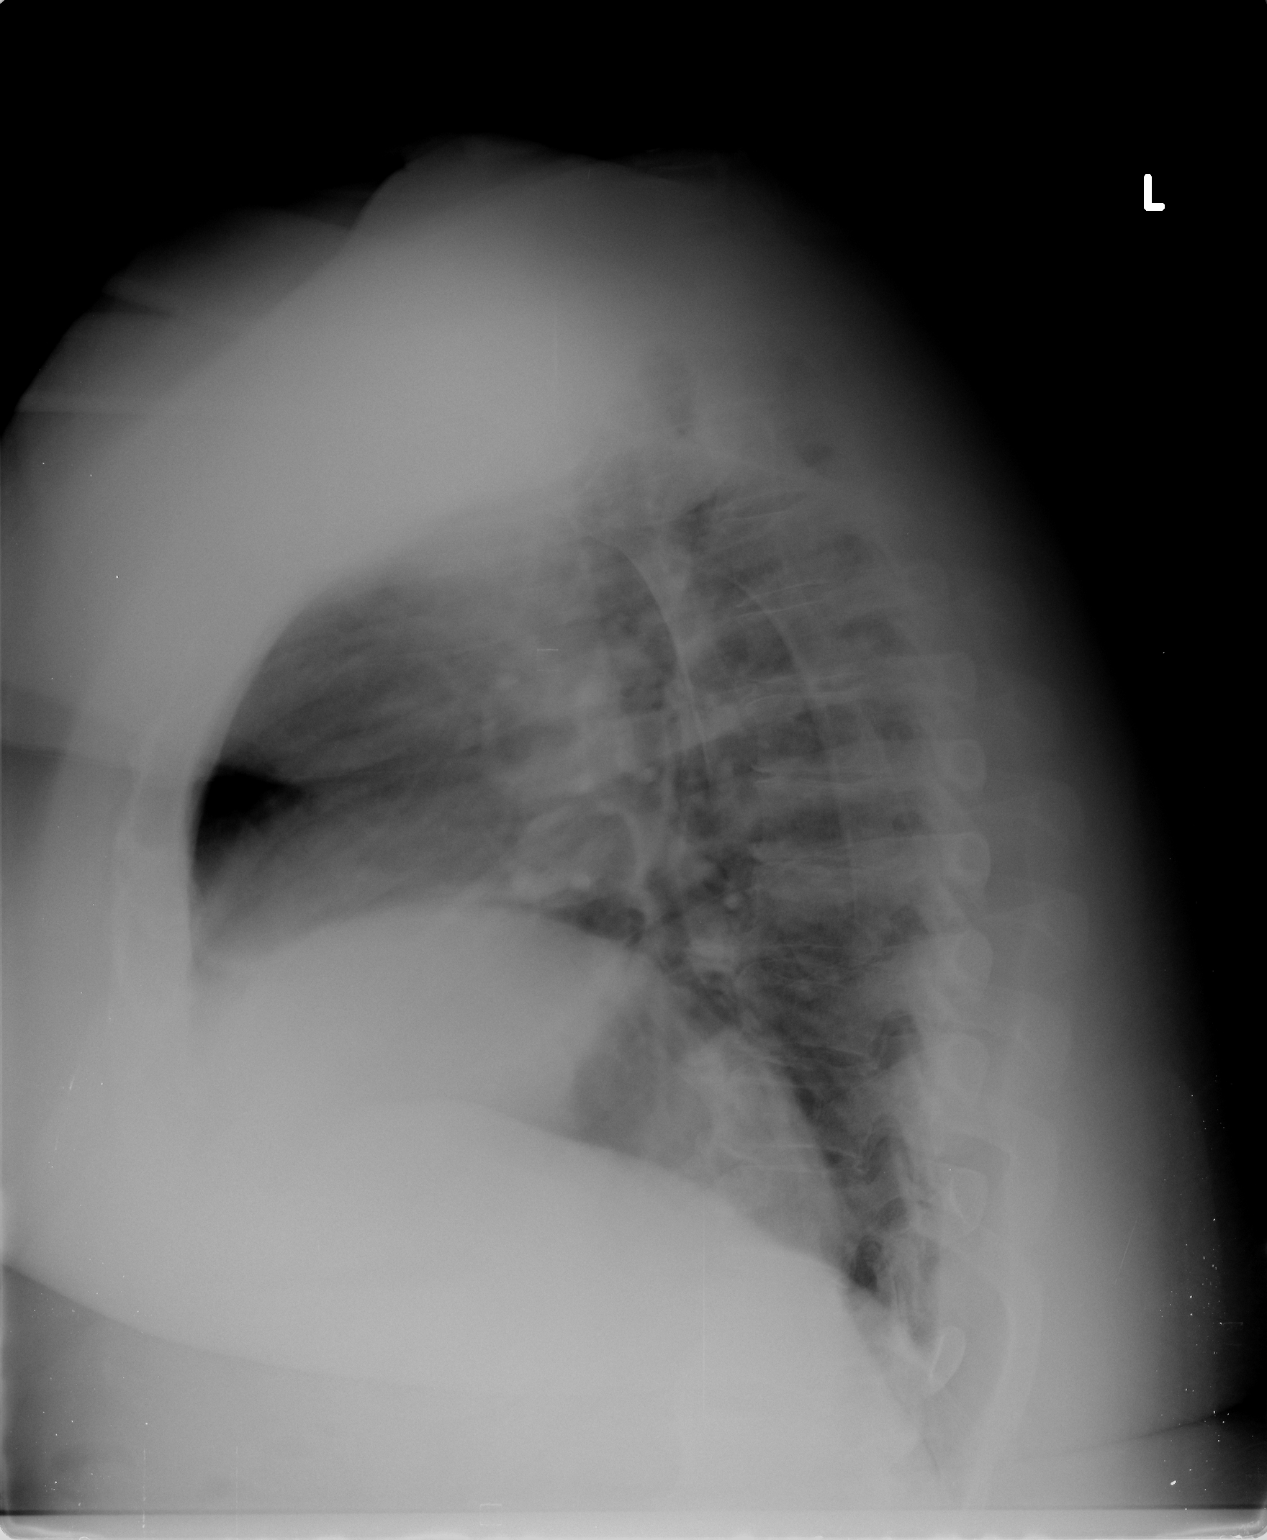

[2 of 2 positions shown; findings below may reference images not displayed]

FINDINGS: There is no change in the chronic elevation of the right
hemidiaphragm.  No active infiltrate or effusion is seen.  The
heart is mildly enlarged and stable.  No acute bony abnormality is
seen.
IMPRESSION: Stable chest x-ray.  No active lung disease.

## 2013-12-04 ENCOUNTER — Other Ambulatory Visit: Payer: Self-pay

## 2014-08-16 ENCOUNTER — Other Ambulatory Visit: Payer: Self-pay

## 2016-10-30 ENCOUNTER — Other Ambulatory Visit: Payer: Self-pay | Admitting: Cardiology

## 2016-10-30 DIAGNOSIS — R079 Chest pain, unspecified: Secondary | ICD-10-CM

## 2016-11-07 ENCOUNTER — Encounter (HOSPITAL_COMMUNITY)
Admission: RE | Admit: 2016-11-07 | Discharge: 2016-11-07 | Disposition: A | Discharge status: Home / Self Care | Payer: BLUE CROSS/BLUE SHIELD | Source: Ambulatory Visit | Attending: Cardiology | Admitting: Cardiology

## 2016-11-07 DIAGNOSIS — R079 Chest pain, unspecified: Secondary | ICD-10-CM | POA: Diagnosis present

## 2016-11-07 MED ORDER — TECHNETIUM TC 99M TETROFOSMIN IV KIT: 10.0000 | PACK | Freq: Once | INTRAVENOUS | Status: DC | PRN

## 2016-11-07 MED ORDER — REGADENOSON 0.4 MG/5ML IV SOLN
INTRAVENOUS | Status: AC
  Filled 2016-11-07: qty 5

## 2016-11-07 MED ORDER — REGADENOSON 0.4 MG/5ML IV SOLN
0.4000 mg | Freq: Once | INTRAVENOUS | Status: AC
  Administered 2016-11-07: 0.4 mg via INTRAVENOUS

## 2016-11-08 ENCOUNTER — Encounter (HOSPITAL_COMMUNITY)
Admission: RE | Admit: 2016-11-08 | Discharge: 2016-11-08 | Disposition: A | Discharge status: Home / Self Care | Payer: BLUE CROSS/BLUE SHIELD | Source: Ambulatory Visit | Attending: Cardiology | Admitting: Cardiology

## 2016-11-08 DIAGNOSIS — R079 Chest pain, unspecified: Secondary | ICD-10-CM | POA: Diagnosis not present

## 2016-11-08 MED ORDER — TECHNETIUM TC 99M TETROFOSMIN IV KIT
30.0000 | PACK | Freq: Once | INTRAVENOUS | Status: AC
  Administered 2016-11-07: 30 via INTRAVENOUS

## 2016-11-08 MED ORDER — TECHNETIUM TC 99M TETROFOSMIN IV KIT
30.0000 | PACK | Freq: Once | INTRAVENOUS | Status: AC
  Administered 2016-11-08: 30 via INTRAVENOUS

## 2017-01-15 ENCOUNTER — Other Ambulatory Visit (HOSPITAL_COMMUNITY)
Admission: AD | Admit: 2017-01-15 | Discharge: 2017-01-15 | Disposition: A | Discharge status: Home / Self Care | Payer: BLUE CROSS/BLUE SHIELD | Source: Ambulatory Visit | Attending: Cardiology | Admitting: Cardiology

## 2017-01-15 DIAGNOSIS — E785 Hyperlipidemia, unspecified: Secondary | ICD-10-CM | POA: Diagnosis present

## 2017-01-15 DIAGNOSIS — E119 Type 2 diabetes mellitus without complications: Secondary | ICD-10-CM | POA: Diagnosis not present

## 2017-01-15 DIAGNOSIS — Z79899 Other long term (current) drug therapy: Secondary | ICD-10-CM | POA: Diagnosis present

## 2017-01-15 LAB — LIPID PANEL
CHOLESTEROL: 163 mg/dL (ref 0–200)
HDL: 31 mg/dL — ABNORMAL LOW (ref >40)
LDL Cholesterol: 112 mg/dL — ABNORMAL HIGH (ref 0–99)
TRIGLYCERIDES: 102 mg/dL (ref <150)
Total CHOL/HDL Ratio: 5.3 RATIO
VLDL: 20 mg/dL (ref 0–40)

## 2017-01-15 LAB — HEMOGLOBIN A1C
HEMOGLOBIN A1C: 9 % — AB (ref 4.8–5.6)
MEAN PLASMA GLUCOSE: 211.6 mg/dL

## 2017-01-15 LAB — HEPATIC FUNCTION PANEL
ALT: 32 U/L (ref 17–63)
AST: 22 U/L (ref 15–41)
Albumin: 3.9 g/dL (ref 3.5–5.0)
Alkaline Phosphatase: 64 U/L (ref 38–126)
BILIRUBIN DIRECT: 0.1 mg/dL (ref 0.1–0.5)
BILIRUBIN INDIRECT: 0.9 mg/dL (ref 0.3–0.9)
TOTAL PROTEIN: 7.2 g/dL (ref 6.5–8.1)
Total Bilirubin: 1 mg/dL (ref 0.3–1.2)

## 2017-01-15 LAB — BASIC METABOLIC PANEL
Anion gap: 9 (ref 5–15)
BUN: 23 mg/dL — AB (ref 6–20)
CALCIUM: 9.2 mg/dL (ref 8.9–10.3)
CO2: 25 mmol/L (ref 22–32)
CREATININE: 0.97 mg/dL (ref 0.61–1.24)
Chloride: 104 mmol/L (ref 101–111)
GFR calc Af Amer: >60 mL/min (ref >60)
GLUCOSE: 159 mg/dL — AB (ref 65–99)
Potassium: 3.7 mmol/L (ref 3.5–5.1)
Sodium: 138 mmol/L (ref 135–145)

## 2017-01-15 LAB — TSH: TSH: 1.23 u[IU]/mL (ref 0.350–4.500)

## 2021-12-19 ENCOUNTER — Other Ambulatory Visit: Payer: Self-pay | Admitting: Orthopedic Surgery

## 2021-12-19 ENCOUNTER — Other Ambulatory Visit: Payer: Self-pay | Admitting: Orthopaedic Surgery

## 2021-12-19 DIAGNOSIS — M25511 Pain in right shoulder: Secondary | ICD-10-CM

## 2021-12-19 DIAGNOSIS — M67911 Unspecified disorder of synovium and tendon, right shoulder: Secondary | ICD-10-CM

## 2022-01-07 ENCOUNTER — Ambulatory Visit
Admission: RE | Admit: 2022-01-07 | Discharge: 2022-01-07 | Disposition: A | Discharge status: Home / Self Care | Payer: Self-pay | Source: Ambulatory Visit | Attending: Orthopedic Surgery | Admitting: Orthopedic Surgery

## 2022-01-07 DIAGNOSIS — M25511 Pain in right shoulder: Secondary | ICD-10-CM

## 2022-01-07 DIAGNOSIS — M67911 Unspecified disorder of synovium and tendon, right shoulder: Secondary | ICD-10-CM

## 2022-08-13 ENCOUNTER — Other Ambulatory Visit: Payer: Self-pay | Admitting: Orthopedic Surgery

## 2022-09-04 NOTE — Progress Notes (Addendum)
COVID Vaccine Completed:  Date of COVID positive in last 90 days:  PCP - Rinaldo Cloud, MD Cardiologist -   Chest x-ray -  EKG -  Stress Test - 11-08-16 Epic ECHO -  Cardiac Cath -  Pacemaker/ICD device last checked: Spinal Cord Stimulator:  Bowel Prep -   Sleep Study - Yes, +sleep apnea CPAP -   Fasting Blood Sugar -  Checks Blood Sugar _____ times a day  Last dose of GLP1 agonist-  N/A GLP1 instructions:  N/A   Jardiance Last dose of SGLT-2 inhibitors-  do not take after 09-09-22 SGLT-2 instructions: Hold 3 days before surgery    Blood Thinner Instructions:  Time Aspirin Instructions:  ASA 325 Last Dose:  Activity level:  Can go up a flight of stairs and perform activities of daily living without stopping and without symptoms of chest pain or shortness of breath.  Able to exercise without symptoms  Unable to go up a flight of stairs without symptoms of     Anesthesia review:   Patient denies shortness of breath, fever, cough and chest pain at PAT appointment  Patient verbalized understanding of instructions that were given to them at the PAT appointment. Patient was also instructed that they will need to review over the PAT instructions again at home before surgery.

## 2022-09-04 NOTE — Patient Instructions (Addendum)
SURGICAL WAITING ROOM VISITATION Patients having surgery or a procedure may have no more than 2 support people in the waiting area - these visitors may rotate.    Children under the age of 56 must have an adult with them who is not the patient.  If the patient needs to stay at the hospital during part of their recovery, the visitor guidelines for inpatient rooms apply. Pre-op nurse will coordinate an appropriate time for 1 support person to accompany patient in pre-op.  This support person may not rotate.    Please refer to the [facility] website for the visitor guidelines for Inpatients (after your surgery is over and you are in a regular room).       Your procedure is scheduled on: 09-13-22   Report to [facility] Main Entrance    Report to admitting at 5:15 AM   Call this number if you have problems the morning of surgery [phone]   Do not eat food :After Midnight.   After Midnight you may have the following liquids until 4:30 AM DAY OF SURGERY  Water Non-Citrus Juices (without pulp, NO RED-Apple, White grape, White cranberry) Black Coffee (NO MILK/CREAM OR CREAMERS, sugar ok)  Clear Tea (NO MILK/CREAM OR CREAMERS, sugar ok) regular and decaf                             Plain Jell-O (NO RED)                                           Fruit ices (not with fruit pulp, NO RED)                                     Popsicles (NO RED)                                                               Sports drinks like Gatorade (NO RED)                   The day of surgery:  Drink ONE (1) Pre-Surgery G2 at 4:30 AM the morning of surgery. Drink in one sitting. Do not sip.  This drink was given to you during your hospital  pre-op appointment visit. Nothing else to drink after completing the Pre-Surgery G2.          If you have questions, please contact your surgeon's office.   FOLLOW ANY ADDITIONAL PRE OP INSTRUCTIONS YOU RECEIVED FROM YOUR SURGEON'S OFFICE!!!     Oral  Hygiene is also important to reduce your risk of infection.                                    Remember - BRUSH YOUR TEETH THE MORNING OF SURGERY WITH YOUR REGULAR TOOTHPASTE   Do NOT smoke after Midnight   Take these medicines the morning of surgery with A SIP OF WATER:   Amlodipine  How to Manage Your Diabetes Before  and After Surgery  Why is it important to control my blood sugar before and after surgery? Improving blood sugar levels before and after surgery helps healing and can limit problems. A way of improving blood sugar control is eating a healthy diet by:  Eating less sugar and carbohydrates  Increasing activity/exercise  Talking with your doctor about reaching your blood sugar goals High blood sugars (greater than 180 mg/dL) can raise your risk of infections and slow your recovery, so you will need to focus on controlling your diabetes during the weeks before surgery. Make sure that the doctor who takes care of your diabetes knows about your planned surgery including the date and location.  How do I manage my blood sugar before surgery? Check your blood sugar at least 4 times a day, starting 2 days before surgery, to make sure that the level is not too high or low. Check your blood sugar the morning of your surgery when you wake up and every 2 hours until you get to the Short Stay unit. If your blood sugar is less than 70 mg/dL, you will need to treat for low blood sugar: Do not take insulin. Treat a low blood sugar (less than 70 mg/dL) with  cup of clear juice (cranberry or apple), 4 glucose tablets, OR glucose gel. Recheck blood sugar in 15 minutes after treatment (to make sure it is greater than 70 mg/dL). If your blood sugar is not greater than 70 mg/dL on recheck, call [phone] for further instructions. Report your blood sugar to the short stay nurse when you get to Short Stay.  If you are admitted to the hospital after surgery: Your blood sugar will be checked by  the staff and you will probably be given insulin after surgery (instead of oral diabetes medicines) to make sure you have good blood sugar levels. The goal for blood sugar control after surgery is 80-180 mg/dL.   WHAT DO I DO ABOUT MY DIABETES MEDICATION?  Do not take oral diabetes medicines (pills) the morning of surgery.  Hold Jardiance 3 days before surgery (do not take after 09/09/22)        Do not take an evening dose of Glimepiride the night before surgery   THE MORNING OF SURGERY, take   10 units of Lantus insulin.  DO NOT TAKE THE FOLLOWING 7 DAYS PRIOR TO SURGERY: Ozempic, Wegovy, Rybelsus (Semaglutide), Byetta (exenatide), Bydureon (exenatide ER), Victoza, Saxenda (liraglutide), or Trulicity (dulaglutide) Mounjaro (Tirzepatide) Adlyxin (Lixisenatide), Polyethylene Glycol Loxenatide.  Reviewed and Endorsed by [facility] Patient Education Committee, August 2015  Bring CPAP mask and tubing day of surgery.                              You may not have any metal on your body including  jewelry, and body piercing             Do not wear lotions, powders, cologne, or deodorant              Men may shave face and neck.   Do not bring valuables to the hospital. [place] IS NOT RESPONSIBLE   FOR VALUABLES.   Contacts, dentures or bridgework may not be worn into surgery.  DO NOT BRING YOUR HOME MEDICATIONS TO THE HOSPITAL. PHARMACY WILL DISPENSE MEDICATIONS LISTED ON YOUR MEDICATION LIST TO YOU DURING YOUR ADMISSION IN THE HOSPITAL!    Patients discharged on the day of surgery will  not be allowed to drive home.  Someone NEEDS to stay with you for the first 24 hours after anesthesia.   Special Instructions: Bring a copy of your healthcare power of attorney and living will documents the day of surgery if you haven't scanned them before.              Please read over the following fact sheets you were given: IF YOU HAVE QUESTIONS ABOUT YOUR PRE-OP INSTRUCTIONS PLEASE CALL  [phone] [name]  If you received a COVID test during your pre-op visit  it is requested that you wear a mask when out in public, stay away from anyone that may not be feeling well and notify your surgeon if you develop symptoms. If you test positive for Covid or have been in contact with anyone that has tested positive in the last 10 days please notify you surgeon.  [place]- Preparing for Total Shoulder Arthroplasty    Before surgery, you can play an important role. Because skin is not sterile, your skin needs to be as free of germs as possible. You can reduce the number of germs on your skin by using the following products. Benzoyl Peroxide Gel Reduces the number of germs present on the skin Applied twice a day to shoulder area starting two days before surgery    ==================================================================  Please follow these instructions carefully:  BENZOYL PEROXIDE 5% GEL  Please do not use if you have an allergy to benzoyl peroxide.   If your skin becomes reddened/irritated stop using the benzoyl peroxide.  Starting two days before surgery, apply as follows: Apply benzoyl peroxide in the morning and at night. Apply after taking a shower. If you are not taking a shower clean entire shoulder front, back, and side along with the armpit with a clean wet washcloth.  Place a quarter-sized dollop on your shoulder and rub in thoroughly, making sure to cover the front, back, and side of your shoulder, along with the armpit.   2 days before ____ AM   ____ PM              1 day before ____ AM   ____ PM                         Do this twice a day for two days.  (Last application is the night before surgery, AFTER using the CHG soap as described below).  Do NOT apply benzoyl peroxide gel on the day of surgery.    Pre-operative 5 CHG Bath Instructions   You can play a key role in reducing the risk of infection after surgery. Your skin needs to be as free of germs  as possible. You can reduce the number of germs on your skin by washing with CHG (chlorhexidine gluconate) soap before surgery. CHG is an antiseptic soap that kills germs and continues to kill germs even after washing.   DO NOT use if you have an allergy to chlorhexidine/CHG or antibacterial soaps. If your skin becomes reddened or irritated, stop using the CHG and notify one of our RNs at  [phone] .   Please shower with the CHG soap starting 4 days before surgery using the following schedule:     Please keep in mind the following:  DO NOT shave, including legs and underarms, starting the day of your first shower.   You may shave your face at any point before/day of surgery.  Place clean sheets on your  bed the day you start using CHG soap. Use a clean washcloth (not used since being washed) for each shower. DO NOT sleep with pets once you start using the CHG.   CHG Shower Instructions:  If you choose to wash your hair and private area, wash first with your normal shampoo/soap.  After you use shampoo/soap, rinse your hair and body thoroughly to remove shampoo/soap residue.  Turn the water OFF and apply about 3 tablespoons (45 ml) of CHG soap to a CLEAN washcloth.  Apply CHG soap ONLY FROM YOUR NECK DOWN TO YOUR TOES (washing for 3-5 minutes)  DO NOT use CHG soap on face, private areas, open wounds, or sores.  Pay special attention to the area where your surgery is being performed.  If you are having back surgery, having someone wash your back for you may be helpful. Wait 2 minutes after CHG soap is applied, then you may rinse off the CHG soap.  Pat dry with a clean towel  Put on clean clothes/pajamas   If you choose to wear lotion, please use ONLY the CHG-compatible lotions on the back of this paper.     Additional instructions for the day of surgery: DO NOT APPLY any lotions, deodorants, cologne, or perfumes.   Put on clean/comfortable clothes.  Brush your teeth.  Ask your nurse  before applying any prescription medications to the skin.      CHG Compatible Lotions   Aveeno Moisturizing lotion  Cetaphil Moisturizing Cream  Cetaphil Moisturizing Lotion  Clairol Herbal Essence Moisturizing Lotion, Dry Skin  Clairol Herbal Essence Moisturizing Lotion, Extra Dry Skin  Clairol Herbal Essence Moisturizing Lotion, Normal Skin  Curel Age Defying Therapeutic Moisturizing Lotion with Alpha Hydroxy  Curel Extreme Care Body Lotion  Curel Soothing Hands Moisturizing Hand Lotion  Curel Therapeutic Moisturizing Cream, Fragrance-Free  Curel Therapeutic Moisturizing Lotion, Fragrance-Free  Curel Therapeutic Moisturizing Lotion, Original Formula  Eucerin Daily Replenishing Lotion  Eucerin Dry Skin Therapy Plus Alpha Hydroxy Crme  Eucerin Dry Skin Therapy Plus Alpha Hydroxy Lotion  Eucerin Original Crme  Eucerin Original Lotion  Eucerin Plus Crme Eucerin Plus Lotion  Eucerin TriLipid Replenishing Lotion  Keri Anti-Bacterial Hand Lotion  Keri Deep Conditioning Original Lotion Dry Skin Formula Softly Scented  Keri Deep Conditioning Original Lotion, Fragrance Free Sensitive Skin Formula  Keri Lotion Fast Absorbing Fragrance Free Sensitive Skin Formula  Keri Lotion Fast Absorbing Softly Scented Dry Skin Formula  Keri Original Lotion  Keri Skin Renewal Lotion Keri Silky Smooth Lotion  Keri Silky Smooth Sensitive Skin Lotion  Nivea Body Creamy Conditioning Oil  Nivea Body Extra Enriched Lotion  Nivea Body Original Lotion  Nivea Body Sheer Moisturizing Lotion Nivea Crme  Nivea Skin Firming Lotion  NutraDerm 30 Skin Lotion  NutraDerm Skin Lotion  NutraDerm Therapeutic Skin Cream  NutraDerm Therapeutic Skin Lotion  ProShield Protective Hand Cream  Provon moisturizing lotion   PATIENT SIGNATURE_________________________________  NURSE SIGNATURE__________________________________  ________________________________________________________________________    [name]  An incentive spirometer is a tool that can help keep your lungs clear and active. This tool measures how well you are filling your lungs with each breath. Taking long deep breaths may help reverse or decrease the chance of developing breathing (pulmonary) problems (especially infection) following: A long period of time when you are unable to move or be active. BEFORE THE PROCEDURE  If the spirometer includes an indicator to show your best effort, your nurse or respiratory therapist will set it to a desired goal. If possible,  sit up straight or lean slightly forward. Try not to slouch. Hold the incentive spirometer in an upright position. INSTRUCTIONS FOR USE  Sit on the edge of your bed if possible, or sit up as far as you can in bed or on a chair. Hold the incentive spirometer in an upright position. Breathe out normally. Place the mouthpiece in your mouth and seal your lips tightly around it. Breathe in slowly and as deeply as possible, raising the piston or the ball toward the top of the column. Hold your breath for 3-5 seconds or for as long as possible. Allow the piston or ball to fall to the bottom of the column. Remove the mouthpiece from your mouth and breathe out normally. Rest for a few seconds and repeat Steps 1 through 7 at least 10 times every 1-2 hours when you are awake. Take your time and take a few normal breaths between deep breaths. The spirometer may include an indicator to show your best effort. Use the indicator as a goal to work toward during each repetition. After each set of 10 deep breaths, practice coughing to be sure your lungs are clear. If you have an incision (the cut made at the time of surgery), support your incision when coughing by placing a pillow or rolled up towels firmly against it. Once you are able to get out of bed, walk around indoors and cough well. You may stop using the incentive spirometer when instructed by your caregiver.  RISKS AND  COMPLICATIONS Take your time so you do not get dizzy or light-headed. If you are in pain, you may need to take or ask for pain medication before doing incentive spirometry. It is harder to take a deep breath if you are having pain. AFTER USE Rest and breathe slowly and easily. It can be helpful to keep track of a log of your progress. Your caregiver can provide you with a simple table to help with this. If you are using the spirometer at home, follow these instructions: SEEK MEDICAL CARE IF:  You are having difficultly using the spirometer. You have trouble using the spirometer as often as instructed. Your pain medication is not giving enough relief while using the spirometer. You develop fever of 100.5 F (38.1 C) or higher. SEEK IMMEDIATE MEDICAL CARE IF:  You cough up bloody sputum that had not been present before. You develop fever of 102 F (38.9 C) or greater. You develop worsening pain at or near the incision site. MAKE SURE YOU:  Understand these instructions. Will watch your condition. Will get help right away if you are not doing well or get worse. Document Released: 06/18/2006 Document Revised: 04/30/2011 Document Reviewed: 08/19/2006 [facility] Patient Information 2014 [place], [place].   ________________________________________________________________________

## 2022-09-07 ENCOUNTER — Other Ambulatory Visit: Payer: Self-pay

## 2022-09-07 ENCOUNTER — Ambulatory Visit (HOSPITAL_COMMUNITY)
Admission: RE | Admit: 2022-09-07 | Discharge: 2022-09-07 | Disposition: A | Discharge status: Home / Self Care | Payer: Medicare Other | Source: Ambulatory Visit | Attending: Orthopedic Surgery | Admitting: Orthopedic Surgery

## 2022-09-07 ENCOUNTER — Encounter (HOSPITAL_COMMUNITY): Payer: Self-pay

## 2022-09-07 ENCOUNTER — Encounter (HOSPITAL_COMMUNITY)
Admission: RE | Admit: 2022-09-07 | Discharge: 2022-09-07 | Disposition: A | Discharge status: Home / Self Care | Payer: Medicare Other | Source: Ambulatory Visit | Attending: Orthopedic Surgery | Admitting: Orthopedic Surgery

## 2022-09-07 VITALS — BP 144/68 | HR 71 | Temp 98.5°F | Resp 20 | Ht 68.0 in | Wt 264.0 lb

## 2022-09-07 DIAGNOSIS — G4733 Obstructive sleep apnea (adult) (pediatric): Secondary | ICD-10-CM | POA: Diagnosis not present

## 2022-09-07 DIAGNOSIS — Z6841 Body Mass Index (BMI) 40.0 and over, adult: Secondary | ICD-10-CM | POA: Insufficient documentation

## 2022-09-07 DIAGNOSIS — M75101 Unspecified rotator cuff tear or rupture of right shoulder, not specified as traumatic: Secondary | ICD-10-CM | POA: Diagnosis not present

## 2022-09-07 DIAGNOSIS — Z794 Long term (current) use of insulin: Secondary | ICD-10-CM | POA: Diagnosis not present

## 2022-09-07 DIAGNOSIS — I10 Essential (primary) hypertension: Secondary | ICD-10-CM | POA: Insufficient documentation

## 2022-09-07 DIAGNOSIS — E669 Obesity, unspecified: Secondary | ICD-10-CM | POA: Insufficient documentation

## 2022-09-07 DIAGNOSIS — Z01818 Encounter for other preprocedural examination: Secondary | ICD-10-CM | POA: Insufficient documentation

## 2022-09-07 DIAGNOSIS — E785 Hyperlipidemia, unspecified: Secondary | ICD-10-CM | POA: Diagnosis not present

## 2022-09-07 DIAGNOSIS — E119 Type 2 diabetes mellitus without complications: Secondary | ICD-10-CM | POA: Insufficient documentation

## 2022-09-07 LAB — CBC
HCT: 47 % (ref 39.0–52.0)
Hemoglobin: 15.1 g/dL (ref 13.0–17.0)
MCH: 27.9 pg (ref 26.0–34.0)
MCHC: 32.1 g/dL (ref 30.0–36.0)
MCV: 86.9 fL (ref 80.0–100.0)
Platelets: 317 10*3/uL (ref 150–400)
RBC: 5.41 MIL/uL (ref 4.22–5.81)
RDW: 14.5 % (ref 11.5–15.5)
WBC: 8.8 10*3/uL (ref 4.0–10.5)
nRBC: 0 % (ref 0.0–0.2)

## 2022-09-07 LAB — BASIC METABOLIC PANEL
Anion gap: 10 (ref 5–15)
BUN: 25 mg/dL — ABNORMAL HIGH (ref 8–23)
CO2: 22 mmol/L (ref 22–32)
Calcium: 9.5 mg/dL (ref 8.9–10.3)
Chloride: 104 mmol/L (ref 98–111)
Creatinine, Ser: 0.98 mg/dL (ref 0.61–1.24)
GFR, Estimated: >60 mL/min (ref >60)
Glucose, Bld: 169 mg/dL — ABNORMAL HIGH (ref 70–99)
Potassium: 4.2 mmol/L (ref 3.5–5.1)
Sodium: 136 mmol/L (ref 135–145)

## 2022-09-07 LAB — HEMOGLOBIN A1C
Hgb A1c MFr Bld: 7.8 % — ABNORMAL HIGH (ref 4.8–5.6)
Mean Plasma Glucose: 177.16 mg/dL

## 2022-09-07 LAB — SURGICAL PCR SCREEN
MRSA, PCR: NEGATIVE
Staphylococcus aureus: NEGATIVE

## 2022-09-07 LAB — GLUCOSE, CAPILLARY: Glucose-Capillary: 165 mg/dL — ABNORMAL HIGH (ref 70–99)

## 2022-09-07 NOTE — Progress Notes (Signed)
COVID Vaccine Completed:  Yes   Date of COVID positive in last 90 days:  None   PCP -Farris Has MD at [facility]   [phone] Cardiologist - Rinaldo Cloud, MD   Chest x-ray -  EKG -  done at PST     09-07-2022 Stress Test - 11-08-16 Epic ECHO -  Cardiac Cath -   Pacemaker/ICD device last checked: Spinal Cord Stimulator:    Sleep Study - Yes, +sleep apnea CPAP -   wears BiPAP   Fasting Blood Sugar - 100-115 Checks Blood Sugar __1   times a day   Last dose of GLP1 agonist-  N/A GLP1 instructions:  N/A   Jardiance Last dose of SGLT-2 inhibitors-  do not take after 09-09-22 SGLT-2 instructions: Hold 3 days before surgery      Blood Thinner Instructions:  Time Aspirin Instructions:  ASA 325   Stopped on 09-06-2022 Last Dose:   Activity level:   Can go up a flight of stairs and perform activities of daily living without stopping and without symptoms of chest pain or shortness of breath.  Yes   Able to exercise without symptoms    Anesthesia review:   HTN, DM2   Patient denies shortness of breath, fever, cough and chest pain at PAT appointment   Patient verbalized understanding of instructions that were given to them at the PAT appointment. Patient was also instructed that they will need to review over the PAT instructions again at home before surgery.

## 2022-09-10 ENCOUNTER — Encounter (HOSPITAL_COMMUNITY): Payer: Self-pay

## 2022-09-10 NOTE — Progress Notes (Signed)
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 PMH significant for obesity, HTN, insulin dependent T2DM, OSA (uses bipap), obesity (BMI 40).  No prior anesthesia complications.  Patient follows with PCP for chronic medical conditions. Last seen on 09/05/22. BP generally controlled. HgA1c 7.8 at PAT visit indicating diabetes is not well controlled. Results relayed to Dr. Veda Canning scheduler Darel Hong who will notify him of results although she says this is not an absolute reason to delay surgery.  Patient previously followed with Dr. Sharyn Lull - last seen in 2022 for HTN, HLD. Has had a low risk stress test in the past in 2018 but has not needed ongoing Cardiology f/u.   VS: BP (!) 144/68 Comment: left arm sitting  Pulse 71   Temp 36.9 C (Oral)   Resp 20   Ht 5\' 8"  (1.727 m)   Wt 119.7 kg   SpO2 98%   BMI 40.14 kg/m   PROVIDERS: Farris Has, MD   LABS: Labs reviewed: Acceptable for surgery. (all labs ordered are listed, but only abnormal results are displayed)  Labs Reviewed  HEMOGLOBIN A1C - Abnormal; Notable for the following components:      Result Value   Hgb A1c MFr Bld 7.8 (*)    All other components within normal limits  BASIC METABOLIC PANEL - Abnormal; Notable for the following components:   Glucose, Bld 169 (*)    BUN 25 (*)    All other components within normal limits  GLUCOSE, CAPILLARY - Abnormal; Notable for the following components:   Glucose-Capillary 165 (*)    All other components within normal limits  SURGICAL PCR SCREEN  CBC     IMAGES:  MRI R shoulder 01/07/22:    IMPRESSION: Full-thickness, full width tears of the  supraspinatus, infraspinatus, and subscapularis tendons with retraction nearly to the glenoid. High-grade atrophy of the cuff musculature.   Ruptured biceps tendon with potentially residual tendon in the bicipital groove.   Moderate-severe glenohumeral osteoarthritis. Mild to moderate AC joint osteoarthritis.   Degenerative superior and posterior labral tearing.   EKG: 09/07/22:  NSR, rate 67 LAD   CV:  NM stress test 11/07/2016  IMPRESSION: 1. There is a small, mild, reversible perfusion defect in the inferior wall mid ventricle.   2. Normal left ventricular wall motion.   3. Left ventricular ejection fraction 59%   4. Non invasive risk stratification*: Low   *2012 Appropriate Use Criteria for Coronary Revascularization Focused Update: J Am Coll Cardiol. 2012;59(9):857-881. http://content.dementiazones.com.aspx?articleid=[email or web address]  Past Medical History:  Diagnosis Date   Arthritis    Diabetes mellitus    takes Janumet and AMaryl daily   Hyperlipemia    takes Lipitor daily   Hypertension    takes Exforge daily   Joint pain    Joint swelling    Peripheral edema    takes Lasix daily   Sleep apnea    uses bipap    Past Surgical History:  Procedure Laterality Date   ACHILLES TENDON SURGERY Left    COLONOSCOPY     JOINT REPLACEMENT  02/20/2004   rt hip   KNEE ARTHROSCOPY     lt   ROTATOR CUFF REPAIR Left  03/26/2011   TONSILLECTOMY     TOTAL HIP ARTHROPLASTY Left 05/16/2012   Procedure: TOTAL HIP ARTHROPLASTY;  Surgeon: Harvie Junior, MD;  Location: [facility];  Service: Orthopedics;  Laterality: Left;   TOTAL KNEE ARTHROPLASTY Left 09/26/2012   Procedure: TOTAL KNEE ARTHROPLASTY;  Surgeon: Harvie Junior, MD;  Location: [facility] OR;  Service: Orthopedics;  Laterality: Left;  GENERAL ANESTHESIA WITH PRE OP FEMORAL NRVE BLOCK     MEDICATIONS:  amLODipine (NORVASC) 10 MG tablet   aspirin EC 325 MG tablet   celecoxib (CELEBREX) 200 MG capsule   docusate sodium  (COLACE) 100 MG capsule   furosemide (LASIX) 40 MG tablet   gabapentin (NEURONTIN) 300 MG capsule   glimepiride (AMARYL) 4 MG tablet   JARDIANCE 25 MG TABS tablet   LANTUS SOLOSTAR 100 UNIT/ML Solostar Pen   metFORMIN (GLUCOPHAGE) 1000 MG tablet   Multiple Vitamin (MULTIVITAMIN WITH MINERALS) TABS   olmesartan (BENICAR) 40 MG tablet   rosuvastatin (CRESTOR) 10 MG tablet   No current facility-administered medications for this encounter.   Marcille Blanco [facility] Surgical Short Stay/Anesthesiology [facility] Phone [phone] 09/10/2022 10:49 AM

## 2022-09-10 NOTE — Anesthesia Preprocedure Evaluation (Signed)
Anesthesia Evaluation  Patient identified by MRN, date of birth, ID band Patient awake    Reviewed: Allergy & Precautions, NPO status , Patient's Chart, lab work & pertinent test results  History of Anesthesia Complications Negative for: history of anesthetic complications  Airway Mallampati: II  TM Distance: >3 FB Neck ROM: Full    Dental  (+) Dental Advisory Given, Teeth Intact   Pulmonary sleep apnea and Continuous Positive Airway Pressure Ventilation    Pulmonary exam normal        Cardiovascular hypertension, Pt. on medications Normal cardiovascular exam     Neuro/Psych negative neurological ROS  negative psych ROS   GI/Hepatic negative GI ROS, Neg liver ROS,,,  Endo/Other  diabetes, Type 2, Insulin Dependent, Oral Hypoglycemic Agents  Morbid obesity  Renal/GU negative Renal ROS     Musculoskeletal  (+) Arthritis ,    Abdominal  (+) + obese  Peds  Hematology negative hematology ROS (+)   Anesthesia Other Findings   Reproductive/Obstetrics                             Anesthesia Physical Anesthesia Plan  ASA: 3  Anesthesia Plan: General   Post-op Pain Management: Tylenol PO (pre-op)* and Regional block*   Induction: Intravenous  PONV Risk Score and Plan: 2 and Treatment may vary due to age or medical condition, Ondansetron and Dexamethasone  Airway Management Planned: Oral ETT  Additional Equipment: None  Intra-op Plan:   Post-operative Plan: Extubation in OR  Informed Consent: I have reviewed the patients History and Physical, chart, labs and discussed the procedure including the risks, benefits and alternatives for the proposed anesthesia with the patient or authorized representative who has indicated his/her understanding and acceptance.     Dental advisory given  Plan Discussed with: CRNA and Anesthesiologist  Anesthesia Plan Comments: (See PAT note)         Anesthesia Quick Evaluation

## 2022-09-13 ENCOUNTER — Ambulatory Visit (HOSPITAL_BASED_OUTPATIENT_CLINIC_OR_DEPARTMENT_OTHER): Payer: Medicare Other | Admitting: Anesthesiology

## 2022-09-13 ENCOUNTER — Ambulatory Visit (HOSPITAL_COMMUNITY)
Admission: RE | Admit: 2022-09-13 | Discharge: 2022-09-13 | Disposition: A | Discharge status: Home / Self Care | Payer: Medicare Other | Attending: Orthopedic Surgery | Admitting: Orthopedic Surgery

## 2022-09-13 ENCOUNTER — Encounter (HOSPITAL_COMMUNITY)
Admission: RE | Disposition: A | Discharge status: Home / Self Care | Payer: Self-pay | Source: Home / Self Care | Attending: Orthopedic Surgery

## 2022-09-13 ENCOUNTER — Encounter (HOSPITAL_COMMUNITY): Payer: Self-pay | Admitting: Orthopedic Surgery

## 2022-09-13 ENCOUNTER — Other Ambulatory Visit: Payer: Self-pay

## 2022-09-13 ENCOUNTER — Ambulatory Visit (HOSPITAL_COMMUNITY): Payer: Medicare Other | Admitting: Medical

## 2022-09-13 DIAGNOSIS — E119 Type 2 diabetes mellitus without complications: Secondary | ICD-10-CM

## 2022-09-13 DIAGNOSIS — M12811 Other specific arthropathies, not elsewhere classified, right shoulder: Secondary | ICD-10-CM

## 2022-09-13 DIAGNOSIS — Z794 Long term (current) use of insulin: Secondary | ICD-10-CM | POA: Insufficient documentation

## 2022-09-13 DIAGNOSIS — Z6841 Body Mass Index (BMI) 40.0 and over, adult: Secondary | ICD-10-CM | POA: Insufficient documentation

## 2022-09-13 DIAGNOSIS — M75101 Unspecified rotator cuff tear or rupture of right shoulder, not specified as traumatic: Secondary | ICD-10-CM | POA: Insufficient documentation

## 2022-09-13 DIAGNOSIS — I10 Essential (primary) hypertension: Secondary | ICD-10-CM

## 2022-09-13 DIAGNOSIS — Z7984 Long term (current) use of oral hypoglycemic drugs: Secondary | ICD-10-CM | POA: Diagnosis not present

## 2022-09-13 DIAGNOSIS — Z7982 Long term (current) use of aspirin: Secondary | ICD-10-CM | POA: Diagnosis not present

## 2022-09-13 DIAGNOSIS — G4730 Sleep apnea, unspecified: Secondary | ICD-10-CM | POA: Insufficient documentation

## 2022-09-13 DIAGNOSIS — Z79899 Other long term (current) drug therapy: Secondary | ICD-10-CM | POA: Diagnosis not present

## 2022-09-13 DIAGNOSIS — M19011 Primary osteoarthritis, right shoulder: Secondary | ICD-10-CM | POA: Diagnosis present

## 2022-09-13 DIAGNOSIS — Z791 Long term (current) use of non-steroidal anti-inflammatories (NSAID): Secondary | ICD-10-CM | POA: Diagnosis not present

## 2022-09-13 DIAGNOSIS — Z01818 Encounter for other preprocedural examination: Secondary | ICD-10-CM

## 2022-09-13 HISTORY — PX: REVERSE SHOULDER ARTHROPLASTY: SHX5054

## 2022-09-13 LAB — GLUCOSE, CAPILLARY
Glucose-Capillary: 153 mg/dL — ABNORMAL HIGH (ref 70–99)
Glucose-Capillary: 142 mg/dL — ABNORMAL HIGH (ref 70–99)

## 2022-09-13 SURGERY — ARTHROPLASTY, SHOULDER, TOTAL, REVERSE
Anesthesia: General | Site: Shoulder | Laterality: Right

## 2022-09-13 MED ORDER — BUPIVACAINE HCL (PF) 0.5 % IJ SOLN
INTRAMUSCULAR | Status: DC | PRN
  Administered 2022-09-13: 15 mL via PERINEURAL

## 2022-09-13 MED ORDER — MIDAZOLAM HCL 2 MG/2ML IJ SOLN
INTRAMUSCULAR | Status: AC
  Filled 2022-09-13: qty 2

## 2022-09-13 MED ORDER — OXYCODONE HCL 5 MG PO TABS
ORAL_TABLET | ORAL | Status: AC
  Filled 2022-09-13: qty 1

## 2022-09-13 MED ORDER — ACETAMINOPHEN 500 MG PO TABS
1000.0000 mg | ORAL_TABLET | Freq: Once | ORAL | Status: AC
  Administered 2022-09-13: 1000 mg via ORAL
  Filled 2022-09-13: qty 2

## 2022-09-13 MED ORDER — LACTATED RINGERS IV SOLN: INTRAVENOUS | Status: DC

## 2022-09-13 MED ORDER — OXYCODONE HCL 5 MG/5ML PO SOLN: 5.0000 mg | Freq: Once | ORAL | Status: AC | PRN

## 2022-09-13 MED ORDER — SODIUM CHLORIDE 0.9 % IR SOLN
Status: DC | PRN
  Administered 2022-09-13: 1000 mL

## 2022-09-13 MED ORDER — FENTANYL CITRATE (PF) 100 MCG/2ML IJ SOLN
INTRAMUSCULAR | Status: AC
  Filled 2022-09-13: qty 2

## 2022-09-13 MED ORDER — 0.9 % SODIUM CHLORIDE (POUR BTL) OPTIME
TOPICAL | Status: DC | PRN
  Administered 2022-09-13: 1000 mL

## 2022-09-13 MED ORDER — FENTANYL CITRATE PF 50 MCG/ML IJ SOSY: 25.0000 ug | PREFILLED_SYRINGE | INTRAMUSCULAR | Status: DC | PRN

## 2022-09-13 MED ORDER — PROPOFOL 10 MG/ML IV BOLUS
INTRAVENOUS | Status: DC | PRN
  Administered 2022-09-13: 160 mg via INTRAVENOUS

## 2022-09-13 MED ORDER — TRANEXAMIC ACID-NACL 1000-0.7 MG/100ML-% IV SOLN
1000.0000 mg | INTRAVENOUS | Status: AC
  Administered 2022-09-13: 1000 mg via INTRAVENOUS
  Filled 2022-09-13: qty 100

## 2022-09-13 MED ORDER — CYCLOBENZAPRINE HCL 5 MG PO TABS: 5.0000 mg | ORAL_TABLET | Freq: Three times a day (TID) | ORAL | 0 refills | Status: AC | PRN

## 2022-09-13 MED ORDER — OXYCODONE HCL 5 MG PO TABS
5.0000 mg | ORAL_TABLET | Freq: Once | ORAL | Status: AC | PRN
  Administered 2022-09-13: 5 mg via ORAL

## 2022-09-13 MED ORDER — WATER FOR IRRIGATION, STERILE IR SOLN
Status: DC | PRN
  Administered 2022-09-13: 2000 mL

## 2022-09-13 MED ORDER — ROCURONIUM BROMIDE 100 MG/10ML IV SOLN
INTRAVENOUS | Status: DC | PRN
  Administered 2022-09-13: 30 mg via INTRAVENOUS

## 2022-09-13 MED ORDER — SUCCINYLCHOLINE CHLORIDE 200 MG/10ML IV SOSY
PREFILLED_SYRINGE | INTRAVENOUS | Status: DC | PRN
  Administered 2022-09-13: 110 mg via INTRAVENOUS

## 2022-09-13 MED ORDER — EPHEDRINE SULFATE (PRESSORS) 50 MG/ML IJ SOLN
INTRAMUSCULAR | Status: DC | PRN
  Administered 2022-09-13 (×3): 5 mg via INTRAVENOUS
  Administered 2022-09-13: 10 mg via INTRAVENOUS
  Administered 2022-09-13 (×2): 5 mg via INTRAVENOUS
  Administered 2022-09-13: 10 mg via INTRAVENOUS

## 2022-09-13 MED ORDER — ONDANSETRON HCL 4 MG/2ML IJ SOLN: 4.0000 mg | Freq: Once | INTRAMUSCULAR | Status: DC | PRN

## 2022-09-13 MED ORDER — BUPIVACAINE LIPOSOME 1.3 % IJ SUSP
INTRAMUSCULAR | Status: DC | PRN
  Administered 2022-09-13: 10 mL via PERINEURAL

## 2022-09-13 MED ORDER — CEFAZOLIN SODIUM-DEXTROSE 2-4 GM/100ML-% IV SOLN
2.0000 g | INTRAVENOUS | Status: AC
  Administered 2022-09-13: 2 g via INTRAVENOUS
  Filled 2022-09-13: qty 100

## 2022-09-13 MED ORDER — DEXAMETHASONE SODIUM PHOSPHATE 10 MG/ML IJ SOLN
INTRAMUSCULAR | Status: DC | PRN
  Administered 2022-09-13: 10 mg via INTRAVENOUS

## 2022-09-13 MED ORDER — ORAL CARE MOUTH RINSE: 15.0000 mL | Freq: Once | OROMUCOSAL | Status: AC

## 2022-09-13 MED ORDER — INSULIN ASPART 100 UNIT/ML IJ SOLN: 0.0000 [IU] | INTRAMUSCULAR | Status: DC | PRN

## 2022-09-13 MED ORDER — FENTANYL CITRATE (PF) 100 MCG/2ML IJ SOLN
INTRAMUSCULAR | Status: DC | PRN
  Administered 2022-09-13 (×2): 50 ug via INTRAVENOUS

## 2022-09-13 MED ORDER — MIDAZOLAM HCL 5 MG/5ML IJ SOLN
INTRAMUSCULAR | Status: DC | PRN
  Administered 2022-09-13: 2 mg via INTRAVENOUS

## 2022-09-13 MED ORDER — OXYCODONE HCL 5 MG PO TABS: 5.0000 mg | ORAL_TABLET | ORAL | 0 refills | Status: AC | PRN

## 2022-09-13 MED ORDER — ONDANSETRON HCL 4 MG/2ML IJ SOLN
INTRAMUSCULAR | Status: DC | PRN
  Administered 2022-09-13: 4 mg via INTRAVENOUS

## 2022-09-13 MED ORDER — LIDOCAINE 2% (20 MG/ML) 5 ML SYRINGE
INTRAMUSCULAR | Status: DC | PRN
  Administered 2022-09-13: 40 mg via INTRAVENOUS

## 2022-09-13 MED ORDER — CHLORHEXIDINE GLUCONATE 0.12 % MT SOLN
15.0000 mL | Freq: Once | OROMUCOSAL | Status: AC
  Administered 2022-09-13: 15 mL via OROMUCOSAL

## 2022-09-13 MED ORDER — SUGAMMADEX SODIUM 200 MG/2ML IV SOLN
INTRAVENOUS | Status: DC | PRN
  Administered 2022-09-13: 150 mg via INTRAVENOUS

## 2022-09-13 SURGICAL SUPPLY — 82 items
AID PSTN UNV HD RSTRNT DISP (MISCELLANEOUS) ×1
BAG COUNTER SPONGE SURGICOUNT (BAG) IMPLANT
BAG SPEC THK2 15X12 ZIP CLS (MISCELLANEOUS) ×1
BAG SPNG CNTER NS LX DISP (BAG)
BAG ZIPLOCK 12X15 (MISCELLANEOUS) ×2 IMPLANT
BASEPLATE P2 COATD GLND 6.5X30 (Shoulder) IMPLANT
BIT DRILL 1.6MX128 (BIT) IMPLANT
BIT DRILL 2.5 DIA 127 CALI (BIT) IMPLANT
BIT DRILL 4 DIA CALIBRATED (BIT) IMPLANT
BLADE SAW SGTL 73X25 THK (BLADE) ×2 IMPLANT
BOOTIES KNEE HIGH SLOAN (MISCELLANEOUS) ×4 IMPLANT
BSPLAT GLND 30 STRL LF SHLDR (Shoulder) ×1 IMPLANT
CLSR STERI-STRIP ANTIMIC 1/2X4 (GAUZE/BANDAGES/DRESSINGS) IMPLANT
COOLER ICEMAN CLASSIC (MISCELLANEOUS) IMPLANT
COVER BACK TABLE 60X90IN (DRAPES) ×2 IMPLANT
COVER SURGICAL LIGHT HANDLE (MISCELLANEOUS) ×2 IMPLANT
DRAPE INCISE IOBAN 66X45 STRL (DRAPES) ×2 IMPLANT
DRAPE ORTHO SPLIT 77X108 STRL (DRAPES) ×2
DRAPE POUCH INSTRU U-SHP 10X18 (DRAPES) ×2 IMPLANT
DRAPE SHEET LG 3/4 BI-LAMINATE (DRAPES) ×2 IMPLANT
DRAPE SURG 17X11 SM STRL (DRAPES) ×2 IMPLANT
DRAPE SURG ORHT 6 SPLT 77X108 (DRAPES) ×4 IMPLANT
DRAPE TOP 10253 STERILE (DRAPES) ×2 IMPLANT
DRAPE U-SHAPE 47X51 STRL (DRAPES) ×2 IMPLANT
DRSG AQUACEL AG ADV 3.5X 6 (GAUZE/BANDAGES/DRESSINGS) ×2 IMPLANT
DURAPREP 26ML APPLICATOR (WOUND CARE) ×4 IMPLANT
ELECT BLADE TIP CTD 4 INCH (ELECTRODE) ×2 IMPLANT
ELECT REM PT RETURN 15FT ADLT (MISCELLANEOUS) ×2 IMPLANT
FACESHIELD WRAPAROUND (MASK) ×1 IMPLANT
FACESHIELD WRAPAROUND OR TEAM (MASK) ×2 IMPLANT
GLOVE BIO SURGEON STRL SZ7.5 (GLOVE) ×2 IMPLANT
GLOVE BIOGEL PI IND STRL 6.5 (GLOVE) ×2 IMPLANT
GLOVE BIOGEL PI IND STRL 8 (GLOVE) ×2 IMPLANT
GLOVE SURG SS PI 6.5 STRL IVOR (GLOVE) ×2 IMPLANT
GOWN STRL REUS W/ TWL LRG LVL3 (GOWN DISPOSABLE) ×2 IMPLANT
GOWN STRL REUS W/ TWL XL LVL3 (GOWN DISPOSABLE) ×2 IMPLANT
GOWN STRL REUS W/TWL LRG LVL3 (GOWN DISPOSABLE) ×1
GOWN STRL REUS W/TWL XL LVL3 (GOWN DISPOSABLE) ×1
HANDPIECE INTERPULSE COAX TIP (DISPOSABLE) ×1
HEAD GLENOID W/SCREW 32MM (Shoulder) IMPLANT
HOOD PEEL AWAY T7 (MISCELLANEOUS) ×6 IMPLANT
INSERT EPOLY STND HUMERUS 36MM (Shoulder) ×1 IMPLANT
INSERT EPOLYSTD HUMERUS 36MM (Shoulder) IMPLANT
KIT BASIN OR (CUSTOM PROCEDURE TRAY) ×2 IMPLANT
KIT TURNOVER KIT A (KITS) IMPLANT
MANIFOLD NEPTUNE II (INSTRUMENTS) ×2 IMPLANT
NDL TROCAR POINT SZ 2 1/2 (NEEDLE) IMPLANT
NEEDLE TROCAR POINT SZ 2 1/2 (NEEDLE) IMPLANT
NS IRRIG 1000ML POUR BTL (IV SOLUTION) ×2 IMPLANT
P2 COATDE GLNOID BSEPLT 6.5X30 (Shoulder) ×1 IMPLANT
PACK SHOULDER (CUSTOM PROCEDURE TRAY) ×2 IMPLANT
PAD COLD SHLDR WRAP-ON (PAD) IMPLANT
PROTECTOR NERVE ULNAR (MISCELLANEOUS) IMPLANT
RESTRAINT HEAD UNIVERSAL NS (MISCELLANEOUS) ×2 IMPLANT
RETRIEVER SUT HEWSON (MISCELLANEOUS) IMPLANT
SCREW BONE LOCKING RSP 5.0X14 (Screw) ×1 IMPLANT
SCREW BONE LOCKING RSP 5.0X30 (Screw) ×1 IMPLANT
SCREW BONE RSP LOCK 5X14 (Screw) IMPLANT
SCREW BONE RSP LOCK 5X18 (Screw) IMPLANT
SCREW BONE RSP LOCK 5X26 (Screw) IMPLANT
SCREW BONE RSP LOCK 5X30 (Screw) IMPLANT
SCREW BONE RSP LOCKING 18MM LG (Screw) ×1 IMPLANT
SCREW BONE RSP LOCKING 5.0X26 (Screw) ×1 IMPLANT
SET HNDPC FAN SPRY TIP SCT (DISPOSABLE) ×2 IMPLANT
SLING ARM FOAM STRAP LRG (SOFTGOODS) IMPLANT
SLING ARM IMMOBILIZER LRG (SOFTGOODS) IMPLANT
SLING ARM IMMOBILIZER MED (SOFTGOODS) IMPLANT
STEM HUMERAL STD SHELL 10X48 (Stem) IMPLANT
STRIP CLOSURE SKIN 1/2X4 (GAUZE/BANDAGES/DRESSINGS) ×4 IMPLANT
SUCTION TUBE FRAZIER 10FR DISP (SUCTIONS) IMPLANT
SUPPORT WRAP ARM LG (MISCELLANEOUS) ×2 IMPLANT
SUT ETHIBOND 2 V 37 (SUTURE) IMPLANT
SUT FIBERWIRE #2 38 REV NDL BL (SUTURE)
SUT MNCRL AB 4-0 PS2 18 (SUTURE) ×2 IMPLANT
SUT VIC AB 2-0 CT1 27 (SUTURE) ×2
SUT VIC AB 2-0 CT1 TAPERPNT 27 (SUTURE) ×4 IMPLANT
SUTURE FIBERWR#2 38 REV NDL BL (SUTURE) IMPLANT
TAPE LABRALWHITE 1.5X36 (TAPE) IMPLANT
TAPE SUT LABRALTAP WHT/BLK (SUTURE) IMPLANT
TOWEL OR 17X26 10 PK STRL BLUE (TOWEL DISPOSABLE) ×2 IMPLANT
TOWEL OR NON WOVEN STRL DISP B (DISPOSABLE) ×2 IMPLANT
WATER STERILE IRR 1000ML POUR (IV SOLUTION) ×2 IMPLANT

## 2022-09-13 NOTE — Transfer of Care (Signed)
Immediate Anesthesia Transfer of Care Note  Patient: [name]  Procedure(s) Performed: REVERSE SHOULDER ARTHROPLASTY (Right: Shoulder)  Patient Location: PACU  Anesthesia Type:GA combined with regional for post-op pain  Level of Consciousness: awake, alert , drowsy, and patient cooperative  Airway & Oxygen Therapy: Patient connected to face mask oxygen  Post-op Assessment: Report given to RN and Post -op Vital signs reviewed and stable  Post vital signs: stable  Last Vitals:  Vitals Value Taken Time  BP 135/59 09/13/22 0855  Temp 36.4 C 09/13/22 0856  Pulse 70 09/13/22 0857  Resp 15 09/13/22 0857  SpO2 98 % 09/13/22 0857  Vitals shown include unfiled device data.  Last Pain:  Vitals:   09/13/22 0856  TempSrc:   PainSc: 0-No pain         Complications: No notable events documented.

## 2022-09-13 NOTE — Anesthesia Postprocedure Evaluation (Signed)
Anesthesia Post Note  Patient: [name]  Procedure(s) Performed: REVERSE SHOULDER ARTHROPLASTY (Right: Shoulder)     Patient location during evaluation: PACU Anesthesia Type: General Level of consciousness: awake and alert Pain management: pain level controlled Vital Signs Assessment: post-procedure vital signs reviewed and stable Respiratory status: spontaneous breathing, nonlabored ventilation and respiratory function stable Cardiovascular status: stable and blood pressure returned to baseline Anesthetic complications: no   No notable events documented.  Last Vitals:  Vitals:   09/13/22 0949 09/13/22 1000  BP: 111/69 131/85  Pulse: 65 66  Resp:    Temp: (!) 36.4 C   SpO2: 94% 94%    Last Pain:  Vitals:   09/13/22 0949  TempSrc:   PainSc: 2                  Beryle Lathe

## 2022-09-13 NOTE — Anesthesia Procedure Notes (Signed)
Procedure Name: Intubation Date/Time: 09/13/2022 7:35 AM  Performed by: Donna Bernard, CRNAPre-anesthesia Checklist: Patient identified, Emergency Drugs available, Suction available, Patient being monitored and Timeout performed Patient Re-evaluated:Patient Re-evaluated prior to induction Oxygen Delivery Method: Circle system utilized Preoxygenation: Pre-oxygenation with 100% oxygen Induction Type: IV induction Ventilation: Mask ventilation without difficulty Laryngoscope Size: Glidescope, Mac and 4 Grade View: Grade I Tube type: Oral Tube size: 7.5 mm Number of attempts: 1 Airway Equipment and Method: Stylet Placement Confirmation: positive ETCO2, ETT inserted through vocal cords under direct vision, CO2 detector and breath sounds checked- equal and bilateral Secured at: 25 cm Tube secured with: Tape Dental Injury: Teeth and Oropharynx as per pre-operative assessment

## 2022-09-13 NOTE — Evaluation (Signed)
Occupational Therapy Evaluation Patient Details Name: [name] MRN: [id number] DOB: February 09, 1956 Today's Date: 09/13/2022   History of Present Illness 67 y.o. 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 Ave Filter on 09/13/22. h/o LT TKA.   Clinical Impression   Pt is a 67 year old male, s/p reverse shoulder replacement without functional use of RT dominant upper extremity secondary to effects of surgery and interscalene block and shoulder precautions. Therapist provided education and instruction to patient and spouse in regards to exercises, precautions, positioning, donning upper extremity clothing and bathing while maintaining shoulder precautions, ice and edema management and donning/doffing sling. Patient and spouse verbalized understanding and demonstrated as needed. Patient needed assistance to donn shirt, underwear, pants, socks and shoes and provided with instruction on compensatory strategies to perform ADLs. Patient limited by decreased ROM in RT shoulder so therefore will need some form of assistance at home and 24/7 will be provided with pt's spouse. Patient and spouse verbalized and/or demonstrated understanding to all instruction. Patient to follow up with MD for further therapy needs.        Recommendations for follow up therapy are one component of a multi-disciplinary discharge planning process, led by the attending physician.  Recommendations may be updated based on patient status, additional functional criteria and insurance authorization.   Assistance Recommended at Discharge Intermittent Supervision/Assistance  Patient can return home with the following A little help with bathing/dressing/bathroom;Assistance with feeding;Assistance with cooking/housework;Assist for transportation    Functional Status Assessment  Patient has had a recent decline in their functional status and  demonstrates the ability to make significant improvements in function in a reasonable and predictable amount of time.  Equipment Recommendations  None recommended by OT    Recommendations for Other Services       Precautions / Restrictions Precautions Precautions: Shoulder Type of Shoulder Precautions: Sling at all times except ADL/exercise Yes; Non weight bearing Yes; AROM elbow, wrist and hand to tolerance Yes; PROM of shoulder No; AROM of shoulder No Shoulder Interventions: Shoulder sling/immobilizer;At all times;Off for dressing/bathing/exercises Precaution Booklet Issued: Yes (comment) Restrictions Weight Bearing Restrictions: Yes RUE Weight Bearing: Non weight bearing      Mobility Bed Mobility               General bed mobility comments: Pt in PCCU recliner    Transfers Overall transfer level: Modified independent Equipment used: None                      Balance Overall balance assessment: Modified Independent                                         ADL either performed or assessed with clinical judgement   ADL Overall ADL's : Needs assistance/impaired Eating/Feeding: Set up;With caregiver independent assisting   Grooming: Adhering to UE precautions;Standing;Modified independent   Upper Body Bathing: With caregiver independent assisting;Adhering to UE precautions;Cueing for compensatory techniques;Minimal assistance   Lower Body Bathing: Minimal assistance;Sit to/from stand;Cueing for compensatory techniques;With caregiver independent assisting   Upper Body Dressing : Minimal assistance;Cueing for sequencing;Cueing for compensatory techniques;Cueing for UE precautions;Adhering to UE precautions;With caregiver independent assisting;Sitting Upper Body Dressing Details (indicate cue type and reason): Pt had purchased adaptive overhead shirts on [employer] with side and sleeve snaps to assist CG. Lower Body Dressing:  Sitting/lateral  leans;Sit to/from stand;Moderate assistance;With caregiver independent assisting Lower Body Dressing Details (indicate cue type and reason): Pt/spouse educated on underwear and shorts at once before standing for ease. Toilet Transfer: Modified Independent;Ambulation   Toileting- Clothing Manipulation and Hygiene: Minimal assistance;Sitting/lateral lean;Sit to/from stand;With caregiver independent assisting Toileting - Clothing Manipulation Details (indicate cue type and reason): Min As for clothing management behind RT shoulder     Functional mobility during ADLs: Modified independent;Supervision/safety       Vision   Vision Assessment?: No apparent visual deficits     Perception     Praxis      Pertinent Vitals/Pain Pain Assessment Pain Assessment: No/denies pain (Still with post-op numbness)     Hand Dominance Right   Extremity/Trunk Assessment Upper Extremity Assessment Upper Extremity Assessment: RUE deficits/detail;LUE deficits/detail RUE Deficits / Details: RUE remained numb from nerve block RUE: Unable to fully assess due to immobilization RUE Sensation: decreased light touch RUE Coordination: decreased fine motor LUE Deficits / Details: h/o shoulder surgery-remote. WFL   Lower Extremity Assessment Lower Extremity Assessment: Overall WFL for tasks assessed   Cervical / Trunk Assessment Cervical / Trunk Assessment: Normal   Communication Communication Communication: No difficulties   Cognition Arousal/Alertness: Awake/alert Behavior During Therapy: WFL for tasks assessed/performed Overall Cognitive Status: Within Functional Limits for tasks assessed                                       General Comments       Exercises Other Exercises Other Exercises: Pt trained in hand/wrist/forearm and elbow exercises with handout to reinforce and scheule of 3x/day x10 reps each. Ot demonstrated and pt demo'd back on non-op UE.   Shoulder Instructions  Shoulder Instructions Donning/doffing shirt without moving shoulder: Minimal assistance;Caregiver independent with task;Patient able to independently direct caregiver Method for sponge bathing under operated UE: Minimal assistance;Caregiver independent with task Donning/doffing sling/immobilizer: Moderate assistance;Caregiver independent with task;Patient able to independently direct caregiver Correct positioning of sling/immobilizer: Independent;Patient able to independently direct caregiver;Caregiver independent with task ROM for elbow, wrist and digits of operated UE: Caregiver independent with task;Patient able to independently direct caregiver Sling wearing schedule (on at all times/off for ADL's): Independent;Patient able to independently direct caregiver;Caregiver independent with task Proper positioning of operated UE when showering: Patient able to independently direct caregiver;Caregiver independent with task;Supervision/safety Dressing change: Caregiver independent with task;Patient able to independently direct caregiver Positioning of UE while sleeping: Independent;Patient able to independently direct caregiver;Caregiver independent with task    Home Living Family/patient expects to be discharged to:: Private residence Living Arrangements: Spouse/significant other Available Help at Discharge: Family;Available 24 hours/day Type of Home: House Home Access: Stairs to enter [employer] of Steps: 1   Home Layout: One level     Bathroom Shower/Tub: Chief Strategy Officer: Standard Bathroom Accessibility: Yes   Home Equipment: Control and instrumentation engineer (2 wheels)   Additional Comments: Pt using no AD prior to admission      Prior Functioning/Environment Prior Level of Function : Independent/Modified Independent                        OT Problem List: Decreased range of motion;Decreased strength      OT Treatment/Interventions:       OT Goals(Current goals can be found in the care plan section) Acute Rehab OT Goals OT Goal Formulation: All assessment and  education complete, DC therapy Potential to Achieve Goals: Good ADL Goals Additional ADL Goal #1: Pt and spouse will demonstrate and/or verbalize understanding to UE/LE dressing, donning/doffing of sling, correct positioning of RT UE, and compensatory strategies for RT axilla hygiene, as well as use of Ice man cryo cuff, while correctly following all shoulder post-op precautions/restrictions.  OT Frequency:      Co-evaluation              AM-PAC OT "6 Clicks" Daily Activity     Outcome Measure Help from another person eating meals?: A Little Help from another person taking care of personal grooming?: A Little Help from another person toileting, which includes using toliet, bedpan, or urinal?: A Little Help from another person bathing (including washing, rinsing, drying)?: A Little Help from another person to put on and taking off regular upper body clothing?: A Little Help from another person to put on and taking off regular lower body clothing?: A Little 6 Click Score: 18   End of Session Equipment Utilized During Treatment:  (sling) Nurse Communication: Other (comment) (OT completed.)  Activity Tolerance: Patient tolerated treatment well Patient left: in chair;with family/visitor present  OT Visit Diagnosis: Muscle weakness (generalized) (M62.81)                Time: 9562-1308 OT Time Calculation (min): 34 min Charges:  OT General Charges $OT Visit: 1 Visit OT Evaluation $OT Eval Low Complexity: 1 Low OT Treatments $Self Care/Home Management : 8-22 mins  [name], OT Acute Rehab Services Office: [phone] 09/13/2022  [name] 09/13/2022, 12:58 PM

## 2022-09-13 NOTE — Discharge Instructions (Addendum)
Discharge Instructions after Reverse Total Shoulder Arthroplasty   A sling has been provided for you. You are to wear this at all times (except for bathing and dressing), until your first post operative visit with Dr. Ave Filter. Please also wear while sleeping at night. While you bath and dress, let the arm/elbow extend straight down to stretch your elbow. Wiggle your fingers and pump your first while your in the sling to prevent hand swelling. Use ice on the shoulder intermittently over the first 48 hours after surgery. Continue to use ice or and ice machine as needed after 48 hours for pain control/swelling.  Pain medicine has been prescribed for you.  Use your medicine liberally over the first 48 hours, and then you can begin to taper your use. You may take Extra Strength Tylenol or Tylenol only in place of the pain pills. DO NOT take ANY nonsteroidal anti-inflammatory pain medications: Advil, Motrin, Ibuprofen, Aleve, Naproxen or Naprosyn.  No driving until seen back in the office Leave your dressing on until your first follow up visit.  You may shower with the dressing.  Hold your arm as if you still have your sling on while you shower. Simply allow the water to wash over the site and then pat dry. Make sure your axilla (armpit) is completely dry after showering.    Please call [phone] during normal business hours or [phone] after hours for any problems. Including the following:  - excessive redness of the incisions - drainage for more than 4 days - fever of more than 101.5 F  *Please note that pain medications will not be refilled after hours or on weekends.  Dental Antibiotics:  In most cases prophylactic antibiotics for Dental procdeures after total joint surgery are not necessary.  Exceptions are as follows:  1. History of prior total joint infection  2. Severely immunocompromised (Organ Transplant, cancer chemotherapy, Rheumatoid biologic meds such as Humera)  3.  Poorly controlled diabetes (A1C &gt; 8.0, blood glucose over 200)  If you have one of these conditions, contact your surgeon for an antibiotic prescription, prior to your dental procedure.

## 2022-09-13 NOTE — Anesthesia Procedure Notes (Signed)
Anesthesia Regional Block: Interscalene brachial plexus block   Pre-Anesthetic Checklist: , timeout performed,  Correct Patient, Correct Site, Correct Laterality,  Correct Procedure, Correct Position, site marked,  Risks and benefits discussed,  Surgical consent,  Pre-op evaluation,  At surgeon's request and post-op pain management  Laterality: Right  Prep: chloraprep       Needles:  Injection technique: Single-shot  Needle Type: Echogenic Needle     Needle Length: 5cm  Needle Gauge: 21     Additional Needles:   Narrative:  Start time: 09/13/2022 6:59 AM End time: 09/13/2022 7:02 AM Injection made incrementally with aspirations every 5 mL.  Performed by: Personally  Anesthesiologist: Beryle Lathe, MD  Additional Notes: No pain on injection. No increased resistance to injection. Injection made in 5cc increments. Good needle visualization. Patient tolerated the procedure well.

## 2022-09-13 NOTE — H&P (Signed)
[name] is an 67 y.o. male.   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  Pain interferes with quality of life and sleep.  Past Medical History:  Diagnosis Date   Arthritis    Diabetes mellitus    takes Janumet and AMaryl daily   Hyperlipemia    takes Lipitor daily   Hypertension    takes Exforge daily   Joint pain    Joint swelling    Peripheral edema    takes Lasix daily   Sleep apnea    uses bipap    Past Surgical History:  Procedure Laterality Date   ACHILLES TENDON SURGERY Left    COLONOSCOPY     JOINT REPLACEMENT  02/20/2004   rt hip   KNEE ARTHROSCOPY     lt   ROTATOR CUFF REPAIR Left 03/26/2011   TONSILLECTOMY     TOTAL HIP ARTHROPLASTY Left 05/16/2012   Procedure: TOTAL HIP ARTHROPLASTY;  Surgeon: Harvie Junior, MD;  Location: [facility] OR;  Service: Orthopedics;  Laterality: Left;   TOTAL KNEE ARTHROPLASTY Left 09/26/2012   Procedure: TOTAL KNEE ARTHROPLASTY;  Surgeon: Harvie Junior, MD;  Location: [facility] OR;  Service: Orthopedics;  Laterality: Left;  GENERAL ANESTHESIA WITH PRE OP FEMORAL NRVE BLOCK     History reviewed. No pertinent family history. Social History:  reports that he has never smoked. He has never used smokeless tobacco. He reports that he does not drink alcohol and does not use drugs.  Allergies:  Allergies  Allergen Reactions   Ozempic (0.25 Or 0.5 Mg-Dose) [Semaglutide(0.25 Or 0.5mg -Dos)]     Stomach cramping   Strawberry Extract Rash    Medications Prior to Admission  Medication Sig Dispense Refill   amLODipine (NORVASC) 10 MG tablet Take 10 mg by mouth daily.     aspirin EC 325 MG tablet Take 325 mg by mouth daily.     celecoxib (CELEBREX) 200 MG capsule Take 200 mg by mouth 2 (two) times daily.     docusate sodium (COLACE) 100 MG capsule Take 100 mg by mouth daily.     furosemide (LASIX) 40 MG tablet Take 40 mg by mouth daily.      gabapentin (NEURONTIN) 300 MG capsule Take 300 mg by mouth daily as needed (pain).     glimepiride (AMARYL) 4 MG tablet Take 4 mg by mouth 2 (two) times daily.     JARDIANCE 25 MG TABS tablet Take 25 mg by mouth daily.     LANTUS SOLOSTAR 100 UNIT/ML Solostar Pen Inject 20 Units into the skin daily.     metFORMIN (GLUCOPHAGE) 1000 MG tablet Take 1,000 mg by mouth 2 (two) times daily with a meal.     Multiple Vitamin (MULTIVITAMIN WITH MINERALS) TABS Take 1 tablet by mouth daily.     olmesartan (BENICAR) 40 MG tablet Take 40 mg by mouth daily.     rosuvastatin (CRESTOR) 10 MG tablet Take 10 mg by mouth at bedtime.      Results for orders placed or performed during the hospital encounter of 09/13/22 (from the past 48 hour(s))  Glucose, capillary     Status: Abnormal   Collection Time: 09/13/22  5:53 AM  Result Value Ref Range   Glucose-Capillary 153 (H) 70 - 99 mg/dL    Comment: Glucose reference range applies only to samples taken after fasting for at least 8 hours.   No results found.  Review of Systems  All other systems reviewed and are negative.   Blood pressure (!) 145/66, pulse 74, temperature 98.1 F (36.7 C), temperature source Oral, resp. rate 18, height 5\' 8"  (1.727 m), weight 119.7 kg, SpO2 97%. Physical Exam Constitutional:      Appearance: He is well-developed.  HENT:     Head: Atraumatic.  Eyes:     Extraocular Movements: Extraocular movements intact.  Cardiovascular:     Pulses: Normal pulses.  Pulmonary:     Effort: Pulmonary effort is normal.  Musculoskeletal:     Comments: R shoulder pain with limited ROM. NVID  Skin:    General: Skin is warm and dry.  Neurological:     Mental Status: He is alert and oriented to person, place, and time.  Psychiatric:        Mood and Affect: Mood normal.      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  Pain interferes with quality of life and sleep. Plan R reverse  TSA Risks / benefits of surgery discussed Consent on chart  NPO for OR Preop antibiotics   Glennon Hamilton, MD 09/13/2022, 6:50 AM

## 2022-09-13 NOTE — Op Note (Signed)
Procedure(s): REVERSE SHOULDER ARTHROPLASTY Procedure Note  [name] male 67 y.o. 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 male  With endstage right shoulder arthritis with irrepairable rotator cuff tear. Pain and dysfunction interfered with quality of life and nonoperative treatment with activity modification, NSAIDS and injections failed.     Surgeon: Glennon Hamilton   Assistants: Fredia Sorrow PA-C Amber was present and scrubbed throughout the procedure and was essential in positioning, retraction, exposure, and closure)  Anesthesia: General endotracheal anesthesia with preoperative interscalene block given by the attending anesthesiologist    Procedure Detail  REVERSE SHOULDER ARTHROPLASTY   Estimated Blood Loss:  200 mL         Drains: none  Blood Given: none          Specimens: none        Complications:  * No complications entered in OR log *         Disposition: PACU - hemodynamically stable.         Condition: Stablestable      OPERATIVE FINDINGS:  A DJO Altivate pressfit reverse total shoulder arthroplasty was placed with a  size 10 stem, a 36 glenosphere, and a standard-mm poly insert. The base plate  fixation was excellent.  PROCEDURE: The patient was identified in the preoperative holding area  where I personally marked the operative site after verifying site, side,  and procedure with the patient. An interscalene block given by  the attending anesthesiologist in the holding area and the patient was taken back to the operating room where all extremities were  carefully padded in position after general anesthesia was induced. She  was placed in a beach-chair position and the operative upper extremity was  prepped and draped in a standard sterile  fashion. An approximately 10-  cm incision was made from the tip of the coracoid process to the center  point of the humerus at the level of the axilla. Dissection was carried  down through subcutaneous tissues to the level of the cephalic vein  which was taken laterally with the deltoid. The pectoralis major was  retracted medially. The subdeltoid space was developed and the lateral  edge of the conjoined tendon was identified. The undersurface of  conjoined tendon was palpated and the musculocutaneous nerve was not in  the field. Retractor was placed underneath the conjoined and second  retractor was placed lateral into the deltoid. The circumflex humeral  artery and vessels were identified and clamped and coagulated. The  biceps tendon was absent.  The subscapularis was chronically torn.  The  joint was then gently externally rotated while the capsule was released  from the humeral neck around to just beyond the 6 o'clock position. At  this point, the joint was dislocated and the humeral head was presented  into the wound. The excessive osteophyte formation was removed with a  large rongeur.  The cutting guide was used to make the appropriate  head cut and the head was saved for potentially bone grafting.  The glenoid was exposed with the arm in an  abducted extended position. The anterior and posterior labrum were  completely excised and the capsule was released circumferentially to  allow for exposure of the glenoid for preparation. The 2.5 mm drill was  placed using the guide in 5-10 inferior angulation  and the tap was then advanced in the same hole. Small and large reamers were then used. The tap was then removed and the Metaglene was then screwed in with excellent purchase.  The peripheral guide was then used to drilled measured and filled peripheral locking screws. The size 36 glenosphere was then impacted on the [facility] taper and the central screw was placed. The humerus was then again  exposed and the diaphyseal reamers were used followed by the metaphyseal reamers. The final broach was left in place in the proximal trial was placed. The joint was reduced and with this implant it was felt that soft tissue tensioning was appropriate with excellent stability and excellent range of motion. Therefore, final humeral stem was placed press-fit.  And then the trial polyethylene inserts were tested again and the above implant was felt to be the most appropriate for final insertion. The joint was reduced taken through full range of motion and felt to be stable. Soft tissue tension was appropriate.  The joint was then copiously irrigated with pulse  lavage and the wound was then closed. The subscapularis was not repaired.  Skin was closed with 2-0 Vicryl in a deep dermal layer and 4-0  Monocryl for skin closure. Steri-Strips were applied. Sterile  dressings were then applied as well as a sling. The patient was allowed  to awaken from general anesthesia, transferred to stretcher, and taken  to recovery room in stable condition.   POSTOPERATIVE PLAN: The patient will be observed in the recovery room and if his pain is well-controlled with regional anesthesia and he is hemodynamically stable he can be discharged home today with family.

## 2022-09-14 ENCOUNTER — Encounter (HOSPITAL_COMMUNITY): Payer: Self-pay | Admitting: Orthopedic Surgery

## 2023-03-25 DIAGNOSIS — E1169 Type 2 diabetes mellitus with other specified complication: Secondary | ICD-10-CM | POA: Diagnosis not present

## 2023-03-25 DIAGNOSIS — G4733 Obstructive sleep apnea (adult) (pediatric): Secondary | ICD-10-CM | POA: Diagnosis not present

## 2023-03-25 DIAGNOSIS — E785 Hyperlipidemia, unspecified: Secondary | ICD-10-CM | POA: Diagnosis not present

## 2023-03-25 DIAGNOSIS — I10 Essential (primary) hypertension: Secondary | ICD-10-CM | POA: Diagnosis not present

## 2023-04-05 DIAGNOSIS — H524 Presbyopia: Secondary | ICD-10-CM | POA: Diagnosis not present

## 2023-04-05 DIAGNOSIS — H5203 Hypermetropia, bilateral: Secondary | ICD-10-CM | POA: Diagnosis not present

## 2023-04-05 DIAGNOSIS — E119 Type 2 diabetes mellitus without complications: Secondary | ICD-10-CM | POA: Diagnosis not present

## 2023-07-23 DIAGNOSIS — I10 Essential (primary) hypertension: Secondary | ICD-10-CM | POA: Diagnosis not present

## 2023-07-23 DIAGNOSIS — E1169 Type 2 diabetes mellitus with other specified complication: Secondary | ICD-10-CM | POA: Diagnosis not present

## 2023-07-23 DIAGNOSIS — G4733 Obstructive sleep apnea (adult) (pediatric): Secondary | ICD-10-CM | POA: Diagnosis not present

## 2023-07-23 DIAGNOSIS — E785 Hyperlipidemia, unspecified: Secondary | ICD-10-CM | POA: Diagnosis not present

## 2023-10-14 ENCOUNTER — Telehealth: Payer: Self-pay | Admitting: Pharmacist

## 2023-10-14 NOTE — Progress Notes (Addendum)
   10/14/2023  Patient ID: [name], male   DOB: 1955/04/02, 68 y.o.   MRN: [id number]  Patient was identified for DM counseling based on A1c greater than 8% on last lab results.   Called to see how Mounjaro 5mg  was doing and ask about BG readings. Goal was to increase dose of Mounjaro 7.5mg  next month, but believe it may have been filled + picked up early today on 8/25 per report  Patient returned my call. Reports no weight loss yet, but appetite decreasing with the Mounjaro 5mg  weekly. No side effects noted either. Current fasting BG of 110 to 120 fasting. Was around 130 to 160 prior to Mounjaro. Only notes mild fatigue- advised this is related to BG lowering and body re-adjusting to normal levels. Should resolve in several months. Did NOT pick-up script today. Says it is filled at the pharmacy- advised NOT to pick it up until next steps decided by PCP. Will follow-up again in 4 weeks. Patient has interest in increasing the Mounjaro to 7.5mg  weekly for weight loss assistance. Sending request to Dr. Kip.   Update from 8/26: Called and left voicemail explaining approval of Mounjaro 7.5mg  by Dr. Kip and that script has been sent to the pharmacy. Will call the pharmacy after their lunch break to have them put the 5mg  dose back and fill the new one instead. Patient agreed it was okay to leave a detailed message today confirming approval, if unable to reach him.  Called pharmacy. Had both options filled. Advised to place the 5mg  box back. Will only be getting the 7.5mg  this month. Confirmed understanding.    [name], PharmD [facility]  Phone Number: [phone]

## 2023-12-09 ENCOUNTER — Telehealth: Payer: Self-pay | Admitting: Pharmacist

## 2023-12-09 NOTE — Progress Notes (Signed)
   12/09/2023  Patient ID: [name], male   DOB: 1955-07-10, 68 y.o.   MRN: [id number]  Patient returned my call.   Reports a little weight loss on this dose. Believes 25 units is the insulin  dose needed to keep him at goal. BG was 95 this morning. Aches have calmed down, but still feels like the muscles are tight. No additional concerns that need addressed at this time prior to 10/22 visit or of major concern. Requesting to continue increasing dose to Mounjaro 12.5mg  weekly.  Next dose request sent to Dr. Kip.  Follow-up in 4 weeks for Mounjaro titration.    [name], PharmD [facility]  Phone Number: [phone]

## 2023-12-11 DIAGNOSIS — Z0000 Encounter for general adult medical examination without abnormal findings: Secondary | ICD-10-CM | POA: Diagnosis not present

## 2023-12-11 DIAGNOSIS — I10 Essential (primary) hypertension: Secondary | ICD-10-CM | POA: Diagnosis not present

## 2023-12-11 DIAGNOSIS — E785 Hyperlipidemia, unspecified: Secondary | ICD-10-CM | POA: Diagnosis not present

## 2023-12-11 DIAGNOSIS — E1169 Type 2 diabetes mellitus with other specified complication: Secondary | ICD-10-CM | POA: Diagnosis not present

## 2024-01-06 ENCOUNTER — Telehealth: Payer: Self-pay | Admitting: Pharmacist

## 2024-01-06 NOTE — Progress Notes (Signed)
   01/06/2024  Patient ID: [name], male   DOB: [birth year], 68 y.o.   MRN: [id number]  Called and spoke with the patient on the phone today for [facility] titration.   Reports no issues with Mounjaro 12.5mg  currently. No side effects. Reports losing another 7 lbs over the past month while on this medication. Would be okay with doing last dose increase to 15mg .   No additional concerns at this time.    [name], PharmD, [facility] Phone Number: [phone]

## 2024-02-03 ENCOUNTER — Telehealth: Payer: Self-pay | Admitting: Pharmacist

## 2024-02-03 NOTE — Progress Notes (Signed)
° °  02/03/2024  Patient ID: [name], male   DOB: 1955-05-31, 68 y.o.   MRN: [id number]  Called and spoke with the patient on the phone today. Reports doing well with his Mounjaro 15mg  at this time. Asked about current weight and said he was at 253 lbs currently. No issues with nausea. Just a little bit of indigestion at times. Tums and/or Famotidine have been doing well to assist. [place] he now has to work on getting a whole new wardrobe due to the weight loss.   Advised nurse sent in refill for the Mounjaro 15mg  already for another 6 months. No additional concerns at this time. Aware this will be my last check-in. Thanked for the assistance.   [name], PharmD, [facility] Phone Number: [phone]
